# Patient Record
Sex: Female | Born: 1980 | Race: White | Hispanic: No | Marital: Single | Smoking: Never smoker
Health system: Southern US, Community
[De-identification: ages and names within clinical notes are randomized; demographics above are authoritative.]

## PROBLEM LIST (undated history)

## (undated) DIAGNOSIS — I10 Essential (primary) hypertension: Secondary | ICD-10-CM

## (undated) HISTORY — PX: TUBAL LIGATION: SHX77

---

## 2016-02-28 ENCOUNTER — Encounter (HOSPITAL_BASED_OUTPATIENT_CLINIC_OR_DEPARTMENT_OTHER): Payer: Self-pay | Admitting: *Deleted

## 2016-02-28 ENCOUNTER — Emergency Department (HOSPITAL_BASED_OUTPATIENT_CLINIC_OR_DEPARTMENT_OTHER): Payer: Self-pay

## 2016-02-28 ENCOUNTER — Emergency Department (HOSPITAL_BASED_OUTPATIENT_CLINIC_OR_DEPARTMENT_OTHER)
Admission: EM | Admit: 2016-02-28 | Discharge: 2016-02-28 | Disposition: A | Discharge status: Home / Self Care | Payer: Self-pay | Attending: Emergency Medicine | Admitting: Emergency Medicine

## 2016-02-28 DIAGNOSIS — B9789 Other viral agents as the cause of diseases classified elsewhere: Secondary | ICD-10-CM

## 2016-02-28 DIAGNOSIS — J069 Acute upper respiratory infection, unspecified: Secondary | ICD-10-CM | POA: Insufficient documentation

## 2016-02-28 LAB — CBC WITH DIFFERENTIAL/PLATELET
BASOS PCT: 0 %
Basophils Absolute: 0 10*3/uL (ref 0.0–0.1)
Eosinophils Absolute: 0.1 10*3/uL (ref 0.0–0.7)
Eosinophils Relative: 1 %
HCT: 40.7 % (ref 36.0–46.0)
HEMOGLOBIN: 13.6 g/dL (ref 12.0–15.0)
LYMPHS ABS: 0.5 10*3/uL — AB (ref 0.7–4.0)
Lymphocytes Relative: 9 %
MCH: 27.5 pg (ref 26.0–34.0)
MCHC: 33.4 g/dL (ref 30.0–36.0)
MCV: 82.2 fL (ref 78.0–100.0)
MONOS PCT: 9 %
Monocytes Absolute: 0.5 10*3/uL (ref 0.1–1.0)
NEUTROS ABS: 4.8 10*3/uL (ref 1.7–7.7)
NEUTROS PCT: 81 %
Platelets: 166 10*3/uL (ref 150–400)
RBC: 4.95 MIL/uL (ref 3.87–5.11)
RDW: 14.2 % (ref 11.5–15.5)
WBC: 5.9 10*3/uL (ref 4.0–10.5)

## 2016-02-28 LAB — BASIC METABOLIC PANEL
ANION GAP: 10 (ref 5–15)
BUN: 11 mg/dL (ref 6–20)
CO2: 25 mmol/L (ref 22–32)
Calcium: 9.2 mg/dL (ref 8.9–10.3)
Chloride: 102 mmol/L (ref 101–111)
Creatinine, Ser: 0.84 mg/dL (ref 0.44–1.00)
GFR calc non Af Amer: >60 mL/min (ref >60)
Glucose, Bld: 106 mg/dL — ABNORMAL HIGH (ref 65–99)
Potassium: 3.4 mmol/L — ABNORMAL LOW (ref 3.5–5.1)
Sodium: 137 mmol/L (ref 135–145)

## 2016-02-28 LAB — RAPID STREP SCREEN (MED CTR MEBANE ONLY): Streptococcus, Group A Screen (Direct): NEGATIVE

## 2016-02-28 MED ORDER — LISINOPRIL 10 MG PO TABS: 10.0000 mg | ORAL_TABLET | Freq: Every day | ORAL | 0 refills | Status: DC

## 2016-02-28 MED ORDER — GUAIFENESIN-CODEINE 100-10 MG/5ML PO SOLN: 5.0000 mL | Freq: Three times a day (TID) | ORAL | 0 refills | Status: DC | PRN

## 2016-02-28 MED ORDER — ACETAMINOPHEN 500 MG PO TABS
1000.0000 mg | ORAL_TABLET | Freq: Once | ORAL | Status: AC
  Administered 2016-02-28: 1000 mg via ORAL
  Filled 2016-02-28: qty 2

## 2016-02-28 MED ORDER — DEXAMETHASONE SODIUM PHOSPHATE 10 MG/ML IJ SOLN
10.0000 mg | Freq: Once | INTRAMUSCULAR | Status: AC
  Administered 2016-02-28: 10 mg via INTRAVENOUS
  Filled 2016-02-28: qty 1

## 2016-02-28 MED ORDER — SODIUM CHLORIDE 0.9 % IV BOLUS (SEPSIS)
1000.0000 mL | Freq: Once | INTRAVENOUS | Status: AC
  Administered 2016-02-28: 1000 mL via INTRAVENOUS

## 2016-02-28 MED FILL — LISINOPRIL 10 MG TABLET: 10 | 30 days supply | Qty: 30 | Fill #0

## 2016-02-28 MED FILL — GUAIATUSSIN AC LIQUID: 100-10 | 8 days supply | Qty: 120 | Fill #0

## 2016-02-28 NOTE — ED Provider Notes (Signed)
[facility] Provider Note   CSN: [id number] Arrival date & time: 02/28/16  1323     History   Chief Complaint Chief Complaint  Patient presents with  . Cough  . Fever    HPI Denise Murphy is a 35 y.o. female.  35 year old Caucasian female with a past medical history significant for hypertension presents to the ED today with multiple complaints including sore throat, sinus congestion, rhinorrhea, fevers, headaches, body aches. Patient states her symptoms started approximately 2 days ago. Patient states that she started with sinus congestion and rhinorrhea and progressed to a sore throat and a nonproductive cough. Patient states that her fever has been max 102.1 at home. She has been alternating Tylenol and ibuprofen. She is also using over-the-counter cold and flu medication with some relief. Patient also reports generalized body aches. She reports sick contacts. Nothing makes better or worse. Patient also states that she has a history of high blood pressure. However she has been out of her medication for the past few months has not had insurance follow-up with her primary care doctor. She states she was on lisinopril but unknown dose. Patient denies any vision changes, chest pain, shortness of breath, lightheadedness, dizziness, abdominal pain, nausea, emesis, urinary symptoms, change in bowel habits, numbness/tingling.   The history is provided by the patient.  Cough  Associated symptoms include chills, headaches, rhinorrhea and sore throat. Pertinent negatives include no chest pain and no shortness of breath.  Fever   Associated symptoms include congestion, headaches, sore throat and cough. Pertinent negatives include no chest pain, no diarrhea and no vomiting.    History reviewed. No pertinent past medical history.  There are no active problems to display for this patient.   Past Surgical History:  Procedure Laterality Date  . TUBAL LIGATION      OB History    No data available       Home Medications    Prior to Admission medications   Medication Sig Start Date End Date Taking? Authorizing Provider  guaiFENesin-codeine 100-10 MG/5ML syrup Take 5 mLs by mouth 3 (three) times daily as needed for cough. 02/28/16   Rise Mu, PA-C  lisinopril (PRINIVIL,ZESTRIL) 10 MG tablet Take 1 tablet (10 mg total) by mouth daily. 02/28/16   Rise Mu, PA-C    Family History No family history on file.  Social History Social History  Substance Use Topics  . Smoking status: Never Smoker  . Smokeless tobacco: Never Used  . Alcohol use No     Allergies   Patient has no known allergies.   Review of Systems Review of Systems  Constitutional: Positive for chills and fever.  HENT: Positive for congestion, postnasal drip, rhinorrhea, sinus pain, sinus pressure and sore throat.   Respiratory: Positive for cough. Negative for shortness of breath.   Cardiovascular: Negative for chest pain.  Gastrointestinal: Negative for abdominal pain, diarrhea, nausea and vomiting.  Genitourinary: Negative for dysuria, frequency, hematuria and urgency.  Musculoskeletal: Negative for neck pain and neck stiffness.  Skin: Negative.   Neurological: Positive for headaches. Negative for dizziness, syncope, weakness, light-headedness and numbness.  All other systems reviewed and are negative.    Physical Exam Updated Vital Signs BP (!) 175/114 (BP Location: Left Arm)   Pulse 84   Temp 98.3 F (36.8 C) (Oral)   Resp 18   Ht 5\' 7"  (1.702 m)   Wt 72.6 kg   LMP 02/07/2016   SpO2 100%   BMI 25.06 kg/m  Physical Exam  Constitutional: She is oriented to person, place, and time. She appears well-developed and well-nourished. No distress.  HENT:  Head: Normocephalic and atraumatic.  Right Ear: Tympanic membrane, external ear and ear canal normal.  Left Ear: Tympanic membrane, external ear and ear canal normal.  Nose: Mucosal edema and rhinorrhea  present. Right sinus exhibits maxillary sinus tenderness and frontal sinus tenderness. Left sinus exhibits maxillary sinus tenderness and frontal sinus tenderness.  Mouth/Throat: Uvula is midline. Mucous membranes are dry. No trismus in the jaw. Oropharyngeal exudate and posterior oropharyngeal erythema present. No posterior oropharyngeal edema or tonsillar abscesses. Tonsils are 1+ on the right. Tonsils are 1+ on the left. No tonsillar exudate.  Eyes: Conjunctivae are normal. Right eye exhibits no discharge. Left eye exhibits no discharge. No scleral icterus.  Neck: Normal range of motion. Neck supple. No thyromegaly present.  Patient with full range of motion of cervical spine with no nuchal rigidity.  Cardiovascular: Regular rhythm, normal heart sounds and intact distal pulses.   Patient is mildly tachycardic on initial exam.  Pulmonary/Chest: Effort normal and breath sounds normal. No respiratory distress. She has no wheezes.  CTAB. Non productive cough noted. Patient is not hypoxic or tachypneic.  Abdominal: Soft. Bowel sounds are normal. She exhibits no distension. There is no tenderness. There is no rebound and no guarding.  Musculoskeletal: Normal range of motion.  Lymphadenopathy:    She has no cervical adenopathy.  Neurological: She is alert and oriented to person, place, and time.  Skin: Skin is warm and dry. Capillary refill takes less than 2 seconds.  Nursing note and vitals reviewed.    ED Treatments / Results  Labs (all labs ordered are listed, but only abnormal results are displayed) Labs Reviewed  BASIC METABOLIC PANEL - Abnormal; Notable for the following:       Result Value   Potassium 3.4 (*)    Glucose, Bld 106 (*)    All other components within normal limits  CBC WITH DIFFERENTIAL/PLATELET - Abnormal; Notable for the following:    Lymphs Abs 0.5 (*)    All other components within normal limits  RAPID STREP SCREEN (NOT AT [facility])  CULTURE, GROUP A STREP [facility])     EKG  EKG Interpretation None       Radiology Dg Chest 2 View  Result Date: 02/28/2016 CLINICAL DATA:  Productive cough for 3 days.  Fever EXAM: CHEST  2 VIEW COMPARISON:  None. FINDINGS: Heart and mediastinal contours are within normal limits. No focal opacities or effusions. No acute bony abnormality. IMPRESSION: No active cardiopulmonary disease. Electronically Signed   By: Charlett NoseKevin  Dover M.D.   On: 02/28/2016 14:44    Procedures Procedures (including critical care time)  Medications Ordered in ED Medications  acetaminophen (TYLENOL) tablet 1,000 mg (1,000 mg Oral Given 02/28/16 1345)  dexamethasone (DECADRON) injection 10 mg (10 mg Intravenous Given 02/28/16 1508)  sodium chloride 0.9 % bolus 1,000 mL (1,000 mLs Intravenous New Bag/Given 02/28/16 1508)     Initial Impression / Assessment and Plan / ED Course  I have reviewed the triage vital signs and the nursing notes.  Pertinent labs & imaging results that were available during my care of the patient were reviewed by me and considered in my medical decision making (see chart for details).  Clinical Course   Patient presents to the ED with multiple complaints including rhinorrhea, sore throat, cough, headache, fever, generalized body aches. Patient was initially tachycardic on exam with a slightly elevated  temperature. She was given Tylenol in the ED. Labs were unremarkable. No leukocytosis noted. Patient mildly dehydrated on exam. Given a liter of fluid. Tachycardia has improved. Repeat heart rate was 84 and patient was afebrile. Pt CXR negative for acute infiltrate. Patients symptoms are consistent with URI, likely viral etiology. Discussed that antibiotics are not indicated for viral infections. Patient with sore throat. Decadron given. Patient also has history of hypertension and states she has not taken her meds for the past few months due to insurance issues. She was also lisinopril but unknown dose. Have given patient 10  mg of lisinopril and encouraged her to follow-up with her primary care doctor. I have given her financial resource guide to help find a primary care coverage in the area. Patient is nontoxic appearing. Encourage importance of staying hydrated.. Pt will be discharged with symptomatic treatment.  Verbalizes understanding and is agreeable with plan. Pt is hemodynamically stable & in NAD prior to dc. Patient given strict return precautions.   Final Clinical Impressions(s) / ED Diagnoses   Final diagnoses:  Viral URI with cough    New Prescriptions New Prescriptions   GUAIFENESIN-CODEINE 100-10 MG/5ML SYRUP    Take 5 mLs by mouth 3 (three) times daily as needed for cough.   LISINOPRIL (PRINIVIL,ZESTRIL) 10 MG TABLET    Take 1 tablet (10 mg total) by mouth daily.     Rise MuKenneth T Tyreonna Czaplicki, PA-C 02/28/16 1649    Raeford RazorStephen Kohut, MD 03/05/16 [phone]

## 2016-02-28 NOTE — ED Triage Notes (Signed)
Cough, headache, body aches and fever x 2 days.

## 2016-02-28 NOTE — Discharge Instructions (Signed)
Your chest xray was normal today. Your labs work was normal. Your strep test was negative. This is likely a viral infection and sinus infection. You may take the cough medicine it will make you sleepy so do not drive with it. You also need to stay hydrated with water. I would also recommend nasal rinses with saline and a nasal decongestant. You need to follow up with a primary care doctor concerning your blood pressure and have it rechecked. Return to the Ed if your symptoms are worsening or not improving in the next 5-6 days. Please get plenty of rest and take tylenol and motrin as needed for fever and body aches.

## 2016-02-28 NOTE — ED Notes (Signed)
Pt reports temp at home of 102.1 over the past few days. Pt alternating tylenol and ibuprofen. Last dose of ibuprofen at 11 this morning.

## 2016-03-02 LAB — CULTURE, GROUP A STREP (THRC)

## 2016-11-11 ENCOUNTER — Emergency Department (HOSPITAL_BASED_OUTPATIENT_CLINIC_OR_DEPARTMENT_OTHER)
Admission: EM | Admit: 2016-11-11 | Discharge: 2016-11-11 | Disposition: A | Discharge status: Home / Self Care | Payer: Self-pay | Attending: Emergency Medicine | Admitting: Emergency Medicine

## 2016-11-11 ENCOUNTER — Encounter (HOSPITAL_BASED_OUTPATIENT_CLINIC_OR_DEPARTMENT_OTHER): Payer: Self-pay | Admitting: *Deleted

## 2016-11-11 DIAGNOSIS — M5441 Lumbago with sciatica, right side: Secondary | ICD-10-CM | POA: Insufficient documentation

## 2016-11-11 DIAGNOSIS — I10 Essential (primary) hypertension: Secondary | ICD-10-CM | POA: Insufficient documentation

## 2016-11-11 HISTORY — DX: Essential (primary) hypertension: I10

## 2016-11-11 LAB — PREGNANCY, URINE: PREG TEST UR: NEGATIVE

## 2016-11-11 LAB — URINALYSIS, ROUTINE W REFLEX MICROSCOPIC
Bilirubin Urine: NEGATIVE
Glucose, UA: NEGATIVE mg/dL
Ketones, ur: 15 mg/dL — AB
LEUKOCYTES UA: NEGATIVE
Nitrite: NEGATIVE
PROTEIN: 30 mg/dL — AB
Specific Gravity, Urine: 1.02 (ref 1.005–1.030)
pH: 7 (ref 5.0–8.0)

## 2016-11-11 LAB — URINALYSIS, MICROSCOPIC (REFLEX): WBC UA: NONE SEEN WBC/hpf (ref 0–5)

## 2016-11-11 MED ORDER — OXYCODONE-ACETAMINOPHEN 5-325 MG PO TABS
1.0000 | ORAL_TABLET | Freq: Once | ORAL | Status: AC
  Administered 2016-11-11: 1 via ORAL
  Filled 2016-11-11: qty 1

## 2016-11-11 MED ORDER — NAPROXEN 500 MG PO TABS: 500.0000 mg | ORAL_TABLET | Freq: Two times a day (BID) | ORAL | 0 refills | Status: DC

## 2016-11-11 MED ORDER — RANITIDINE HCL 150 MG PO CAPS: 150.0000 mg | ORAL_CAPSULE | Freq: Every day | ORAL | 0 refills | Status: DC

## 2016-11-11 MED ORDER — DEXAMETHASONE SODIUM PHOSPHATE 10 MG/ML IJ SOLN
10.0000 mg | Freq: Once | INTRAMUSCULAR | Status: AC
  Administered 2016-11-11: 10 mg via INTRAVENOUS
  Filled 2016-11-11: qty 1

## 2016-11-11 MED ORDER — METHOCARBAMOL 500 MG PO TABS: 500.0000 mg | ORAL_TABLET | Freq: Two times a day (BID) | ORAL | 0 refills | Status: DC | PRN

## 2016-11-11 MED ORDER — PREDNISONE 10 MG (21) PO TBPK: ORAL_TABLET | Freq: Every day | ORAL | 0 refills | Status: DC

## 2016-11-11 NOTE — ED Triage Notes (Signed)
Pt reports mid to lower back pain radiating down right leg. Denies injury or trauma.

## 2016-11-11 NOTE — ED Provider Notes (Signed)
[facility]   CSN: [id number] Arrival date & time: 11/11/16  1504     History   Chief Complaint Chief Complaint  Patient presents with  . Back Pain    HPI Denise Murphy is a 36 y.o. female presenting with back pain.  Patient states that a week ago, she had bilateral low back pain. She tried to manage this conservatively with Tylenol, ibuprofen, ice, and heat, but the pain has continued to worsen. She now reports a burning radiation down the back of her right leg, past her knee. Additionally, she has bilateral upper back soreness, which started a few days ago. Her pain is worse with movement, and nothing makes it better. She denies increased activity or strain his activity. She denies fall, trauma, or injury. She has had back pain once in the past after having pain after lifting a heavy box. She denies fevers, chills, chest pain, shortness of breath, nausea, vomiting, abdominal pain, urinary symptoms, or abnormal bowel movements. She denies loss of bowel or bladder control. She denies any numbness. She denies history of cancer or IV drug use. She denies headache or neck stiffness.  Patient states she has medical history of hypertension, for which she is not taking medications.  HPI  Past Medical History:  Diagnosis Date  . Hypertension     There are no active problems to display for this patient.   Past Surgical History:  Procedure Laterality Date  . TUBAL LIGATION      OB History    No data available       Home Medications    Prior to Admission medications   Medication Sig Start Date End Date Taking? Authorizing Provider  methocarbamol (ROBAXIN) 500 MG tablet Take 1 tablet (500 mg total) by mouth 2 (two) times daily as needed for muscle spasms. Take only if you continue to have muscle stiffness and soreness 11/11/16   Rochell Mabie, PA-C  naproxen (NAPROSYN) 500 MG tablet Take 1 tablet (500 mg total) by mouth 2 (two) times daily with a  meal. 11/11/16 11/25/16  Marykatherine Sherwood, PA-C  predniSONE (STERAPRED UNI-PAK 21 TAB) 10 MG (21) TBPK tablet Take by mouth daily. Take 6 tabs by mouth daily  for 2 days, then 5 tabs for 2 days, then 4 tabs for 2 days, then 3 tabs for 2 days, 2 tabs for 2 days, then 1 tab by mouth daily for 2 days 11/11/16   Zyniah Ferraiolo, PA-C  ranitidine (ZANTAC) 150 MG capsule Take 1 capsule (150 mg total) by mouth daily. 11/11/16   Rahkim Rabalais, PA-C    Family History History reviewed. No pertinent family history.  Social History Social History  Substance Use Topics  . Smoking status: Never Smoker  . Smokeless tobacco: Never Used  . Alcohol use No     Allergies   Patient has no known allergies.   Review of Systems Review of Systems  Constitutional: Negative for chills and fever.  Respiratory: Negative for cough, chest tightness and shortness of breath.   Gastrointestinal: Negative for blood in stool, constipation, diarrhea, nausea and vomiting.  Musculoskeletal: Positive for back pain.  Skin: Negative for rash and wound.  Allergic/Immunologic: Negative for immunocompromised state.  Neurological: Negative for dizziness, facial asymmetry and numbness.  Hematological: Does not bruise/bleed easily.  Psychiatric/Behavioral: Negative for agitation, confusion and decreased concentration.     Physical Exam Updated Vital Signs BP (!) 165/104 (BP Location: Right Arm)   Pulse 88   Temp 98.4  F (36.9 C) (Oral)   Resp 18   LMP 11/03/2016   SpO2 98%   Physical Exam  Constitutional: She is oriented to person, place, and time. She appears well-developed and well-nourished.  Patient appears uncomfortable due to pain  HENT:  Head: Normocephalic and atraumatic.  Eyes: EOM are normal.  Neck: Normal range of motion. Neck supple.  No tenderness to palpation of midline cervical spine. Patient moving head and neck easily without pain or stiffness  Cardiovascular: Normal rate, regular rhythm and  intact distal pulses.   Pulmonary/Chest: Effort normal and breath sounds normal. No respiratory distress. She has no wheezes. She has no rales. She exhibits no tenderness.  Abdominal: Soft. Bowel sounds are normal. She exhibits no distension. There is no tenderness.  Musculoskeletal: Normal range of motion.  Tenderness to palpation of bilateral upper thoracic back musculature. No tenderness to palpation of midline spine. No pain of lower thoracic/upper lumbar back. Tenderness to palpation of lumbar back bilaterally, with out increased tenderness to palpation over spinous processes. Straight leg reproduces pain and causes burning to extend down the right leg past the knee. Strength intact 4. Sensation intact 4. Radial and pedal pulses equal bilaterally. No obvious rashes, contusions, or deformities of the back.  Neurological: She is alert and oriented to person, place, and time. She has normal strength. No sensory deficit. GCS eye subscore is 4. GCS verbal subscore is 5. GCS motor subscore is 6.  Skin: Skin is warm and dry. No rash noted.  Psychiatric: She has a normal mood and affect.  Nursing note and vitals reviewed.    ED Treatments / Results  Labs (all labs ordered are listed, but only abnormal results are displayed) Labs Reviewed  URINALYSIS, ROUTINE W REFLEX MICROSCOPIC - Abnormal; Notable for the following:       Result Value   Hgb urine dipstick TRACE (*)    Ketones, ur 15 (*)    Protein, ur 30 (*)    All other components within normal limits  URINALYSIS, MICROSCOPIC (REFLEX) - Abnormal; Notable for the following:    Bacteria, UA MANY (*)    Squamous Epithelial / LPF 6-30 (*)    All other components within normal limits  PREGNANCY, URINE    EKG  EKG Interpretation None       Radiology No results found.  Procedures Procedures (including critical care time)  Medications Ordered in ED Medications  oxyCODONE-acetaminophen (PERCOCET/ROXICET) 5-325 MG per tablet 1  tablet (1 tablet Oral Given 11/11/16 1611)  dexamethasone (DECADRON) injection 10 mg (10 mg Intravenous Given 11/11/16 1611)     Initial Impression / Assessment and Plan / ED Course  I have reviewed the triage vital signs and the nursing notes.  Pertinent labs & imaging results that were available during my care of the patient were reviewed by me and considered in my medical decision making (see chart for details).     Patient presenting with back pain 1 week. No red flags for back pain. Physical exam shows tenderness to palpation of lower back bilaterally with positive straight leg raise. Patient also with tenderness palpation of upper back musculature. Patient neurovascularly intact. Appears uncomfortable due to pain. Blood pressure elevated, patient with a history of hypertension and in pain. Discussed follow-up with primary care for monitoring and management of blood pressure. Will give Decadron and percocet here, and d/c with NSAIDs, steroids and ranitidine for ulcer prevention. Pt give robaxin rx for use only if upper back soreness is not improved  at the end of the prednisone course. At this time, pt appears safe for discharge. Return precautions given. Pt states she understands and agrees to plan.   Final Clinical Impressions(s) / ED Diagnoses   Final diagnoses:  Acute bilateral low back pain with right-sided sciatica    New Prescriptions Discharge Medication List as of 11/11/2016  4:06 PM    START taking these medications   Details  methocarbamol (ROBAXIN) 500 MG tablet Take 1 tablet (500 mg total) by mouth 2 (two) times daily as needed for muscle spasms. Take only if you continue to have muscle stiffness and soreness, Starting Wed 11/11/2016, Print    naproxen (NAPROSYN) 500 MG tablet Take 1 tablet (500 mg total) by mouth 2 (two) times daily with a meal., Starting Wed 11/11/2016, Until Wed 11/25/2016, Print    predniSONE (STERAPRED UNI-PAK 21 TAB) 10 MG (21) TBPK tablet Take by  mouth daily. Take 6 tabs by mouth daily  for 2 days, then 5 tabs for 2 days, then 4 tabs for 2 days, then 3 tabs for 2 days, 2 tabs for 2 days, then 1 tab by mouth daily for 2 days, Starting Wed 11/11/2016, Print    ranitidine (ZANTAC) 150 MG capsule Take 1 capsule (150 mg total) by mouth daily., Starting Wed 11/11/2016, Print         [place], [place], PA-C 11/11/16 1731    Pricilla Loveless, MD 11/13/16 1243

## 2016-11-11 NOTE — Discharge Instructions (Signed)
Take naproxen twice a day with meals. Do not take other anti-inflammatories at the same time (Advil, ibuprofen, Motrin, Aleve). You may take Tylenol as needed for further pain control. Take prednisone as prescribed. Every 2 days, you will take slightly less. Take ranitidine once daily while taking the 2 medicines together to prevent stomach ulcer. You may use Robaxin twice a day for muscle stiffness or soreness after you have finished prednisone. Do not take the 2 medicines together. Have caution while taking Robaxin, as this may make you tired. Do not drive or operate heavy machinery until you know how this affects see. Do not do any strenuous activity or heavy lifting until symptoms have completely resolved. Return to the emergency room if you develop fever, chills, loss of bowel or bladder control, or any new or worsening symptoms.  You may follow up with [facility] for primary care needs. This is important, as it is very important to keep your blood pressure under control.

## 2016-11-11 NOTE — ED Notes (Signed)
Patient has difficulty moving her BLE to the bed d/t low back pain.

## 2016-11-16 ENCOUNTER — Emergency Department (HOSPITAL_BASED_OUTPATIENT_CLINIC_OR_DEPARTMENT_OTHER): Payer: Self-pay

## 2016-11-16 ENCOUNTER — Emergency Department (HOSPITAL_BASED_OUTPATIENT_CLINIC_OR_DEPARTMENT_OTHER)
Admission: EM | Admit: 2016-11-16 | Discharge: 2016-11-17 | Disposition: A | Discharge status: Home / Self Care | Payer: Self-pay | Attending: Emergency Medicine | Admitting: Emergency Medicine

## 2016-11-16 ENCOUNTER — Encounter (HOSPITAL_BASED_OUTPATIENT_CLINIC_OR_DEPARTMENT_OTHER): Payer: Self-pay

## 2016-11-16 DIAGNOSIS — R55 Syncope and collapse: Secondary | ICD-10-CM

## 2016-11-16 DIAGNOSIS — R519 Headache, unspecified: Secondary | ICD-10-CM

## 2016-11-16 DIAGNOSIS — I160 Hypertensive urgency: Secondary | ICD-10-CM | POA: Insufficient documentation

## 2016-11-16 DIAGNOSIS — R51 Headache: Secondary | ICD-10-CM | POA: Insufficient documentation

## 2016-11-16 DIAGNOSIS — Z79899 Other long term (current) drug therapy: Secondary | ICD-10-CM | POA: Insufficient documentation

## 2016-11-16 LAB — CBC WITH DIFFERENTIAL/PLATELET
BASOS ABS: 0 10*3/uL (ref 0.0–0.1)
Basophils Relative: 0 %
Eosinophils Absolute: 0 10*3/uL (ref 0.0–0.7)
Eosinophils Relative: 0 %
HEMATOCRIT: 41.5 % (ref 36.0–46.0)
Hemoglobin: 14.3 g/dL (ref 12.0–15.0)
LYMPHS PCT: 12 %
Lymphs Abs: 1.5 10*3/uL (ref 0.7–4.0)
MCH: 27.8 pg (ref 26.0–34.0)
MCHC: 34.5 g/dL (ref 30.0–36.0)
MCV: 80.6 fL (ref 78.0–100.0)
Monocytes Absolute: 0.6 10*3/uL (ref 0.1–1.0)
Monocytes Relative: 5 %
NEUTROS ABS: 10.3 10*3/uL — AB (ref 1.7–7.7)
NEUTROS PCT: 83 %
Platelets: 269 10*3/uL (ref 150–400)
RBC: 5.15 MIL/uL — AB (ref 3.87–5.11)
RDW: 14 % (ref 11.5–15.5)
WBC: 12.4 10*3/uL — AB (ref 4.0–10.5)

## 2016-11-16 MED ORDER — DIPHENHYDRAMINE HCL 50 MG/ML IJ SOLN
25.0000 mg | Freq: Once | INTRAMUSCULAR | Status: AC
  Administered 2016-11-16: 25 mg via INTRAVENOUS
  Filled 2016-11-16: qty 1

## 2016-11-16 MED ORDER — SODIUM CHLORIDE 0.9 % IV BOLUS (SEPSIS)
1000.0000 mL | Freq: Once | INTRAVENOUS | Status: AC
  Administered 2016-11-16: 1000 mL via INTRAVENOUS

## 2016-11-16 MED ORDER — PROCHLORPERAZINE EDISYLATE 5 MG/ML IJ SOLN
10.0000 mg | Freq: Once | INTRAMUSCULAR | Status: AC
  Administered 2016-11-16: 10 mg via INTRAVENOUS
  Filled 2016-11-16: qty 2

## 2016-11-16 NOTE — ED Provider Notes (Signed)
[facility] Provider Note   CSN: [id number] Arrival date & time: 11/16/16  2231     History   Chief Complaint Chief Complaint  Patient presents with  . Loss of Consciousness    HPI Denise Murphy is a 36 y.o. female.  HPI  This is a 36 year old female with a history of hypertension who presents with headache, chest pain, syncope. Patient reports over the last several days she has not felt "right." She knows that her blood pressure has been elevated but does not have insurance and is not currently taking blood pressure medication. She reports several day history of gradually worsening headache. Currently her headache is 10 out of 10. Denies worst headache of her life. Denies weakness, numbness, tingling, vision changes. Also reports occasional chest tightness. Denies chest tightness at this time. Patient reports that earlier this evening she got up to go to the bathroom. She states "I blacked out." She was found by her boyfriend on the floor. Prior to syncope, she states that she felt ringing in her ears and pounding in her head.  Past Medical History:  Diagnosis Date  . Hypertension     There are no active problems to display for this patient.   Past Surgical History:  Procedure Laterality Date  . TUBAL LIGATION      OB History    No data available       Home Medications    Prior to Admission medications   Medication Sig Start Date End Date Taking? Authorizing Provider  hydrochlorothiazide (HYDRODIURIL) 25 MG tablet Take 1 tablet (25 mg total) by mouth daily. 11/17/16   Kaeden Mester, Mayer Masker, MD  lisinopril (PRINIVIL,ZESTRIL) 10 MG tablet Take 1 tablet (10 mg total) by mouth daily. 11/17/16   Nikalas Bramel, Mayer Masker, MD  methocarbamol (ROBAXIN) 500 MG tablet Take 1 tablet (500 mg total) by mouth 2 (two) times daily as needed for muscle spasms. Take only if you continue to have muscle stiffness and soreness 11/11/16   Caccavale, Sophia, PA-C  naproxen (NAPROSYN) 500 MG  tablet Take 1 tablet (500 mg total) by mouth 2 (two) times daily with a meal. 11/11/16 11/25/16  Caccavale, Sophia, PA-C  predniSONE (STERAPRED UNI-PAK 21 TAB) 10 MG (21) TBPK tablet Take by mouth daily. Take 6 tabs by mouth daily  for 2 days, then 5 tabs for 2 days, then 4 tabs for 2 days, then 3 tabs for 2 days, 2 tabs for 2 days, then 1 tab by mouth daily for 2 days 11/11/16   Caccavale, Sophia, PA-C  ranitidine (ZANTAC) 150 MG capsule Take 1 capsule (150 mg total) by mouth daily. 11/11/16   Caccavale, Sophia, PA-C    Family History No family history on file.  Social History Social History  Substance Use Topics  . Smoking status: Never Smoker  . Smokeless tobacco: Never Used  . Alcohol use Yes     Comment: occ     Allergies   Patient has no known allergies.   Review of Systems Review of Systems  Constitutional: Negative for fever.  Respiratory: Positive for chest tightness. Negative for shortness of breath.   Cardiovascular: Negative for chest pain and leg swelling.  Gastrointestinal: Positive for nausea. Negative for abdominal pain and vomiting.  Genitourinary: Negative for dysuria.  Musculoskeletal: Positive for back pain.  Neurological: Positive for syncope and headaches. Negative for weakness and numbness.  All other systems reviewed and are negative.    Physical Exam Updated Vital Signs BP (!) 199/119 (BP  Location: Left Arm)   Pulse 73   Temp 98.4 F (36.9 C) (Oral)   Resp 18   Ht  (1.702 m)   Wt 72.6 kg (160 lb)   LMP 11/03/2016   SpO2 99%   BMI 25.06 kg/m   Physical Exam  Constitutional: She is oriented to person, place, and time. She appears well-developed and well-nourished.  Overweight, anxious appearing  HENT:  Head: Normocephalic and atraumatic.  Eyes: Pupils are equal, round, and reactive to light. EOM are normal.  Neck: Normal range of motion. Neck supple.  Normal range of motion, no meningismus  Cardiovascular: Regular rhythm and normal  heart sounds.   Tachycardia  Pulmonary/Chest: Effort normal and breath sounds normal. No respiratory distress. She has no wheezes.  Abdominal: Soft. Bowel sounds are normal. There is no tenderness. There is no guarding.  Neurological: She is alert and oriented to person, place, and time.  55 strength in all 4 extremities, no dysmetria to finger-nose-finger, cranial nerves II through XII intact  Skin: Skin is warm and dry.  Psychiatric: She has a normal mood and affect.  Nursing note and vitals reviewed.    ED Treatments / Results  Labs (all labs ordered are listed, but only abnormal results are displayed) Labs Reviewed  CBC WITH DIFFERENTIAL/PLATELET - Abnormal; Notable for the following:       Result Value   WBC 12.4 (*)    RBC 5.15 (*)    Neutro Abs 10.3 (*)    All other components within normal limits  BASIC METABOLIC PANEL - Abnormal; Notable for the following:    Potassium 3.4 (*)    Glucose, Bld 132 (*)    BUN 21 (*)    All other components within normal limits  PREGNANCY, URINE  TROPONIN I  D-DIMER, QUANTITATIVE (NOT AT [facility])    EKG  EKG Interpretation  Date/Time:  Monday November 16 2016 22:50:51 EDT Ventricular Rate:  122 PR Interval:  142 QRS Duration: 78 QT Interval:  318 QTC Calculation: 453 R Axis:   37 Text Interpretation:  Sinus tachycardia Biatrial enlargement Septal infarct , age undetermined Abnormal ECG no previous EKG significant sinus tachycardia  Confirmed by Crista Curb [id number]) on 11/16/2016 10:54:42 PM       Radiology Dg Chest 2 View  Result Date: 11/16/2016 CLINICAL DATA:  Headache for days, chest pain for several days but worse today. Hypertension. EXAM: CHEST  2 VIEW COMPARISON:  Chest x-ray dated 02/28/2016. FINDINGS: The heart size and mediastinal contours are within normal limits. Both lungs are clear. The visualized skeletal structures are unremarkable. IMPRESSION: No active cardiopulmonary disease. No evidence of pneumonia or pulmonary  edema. Electronically Signed   By: Bary Richard M.D.   On: 11/16/2016 23:55   Ct Head Wo Contrast  Result Date: 11/16/2016 CLINICAL DATA:  Headache for several days EXAM: CT HEAD WITHOUT CONTRAST TECHNIQUE: Contiguous axial images were obtained from the base of the skull through the vertex without intravenous contrast. COMPARISON:  None. FINDINGS: Brain: No evidence of acute infarction, hemorrhage, hydrocephalus, extra-axial collection or mass lesion/mass effect. Vascular: No hyperdense vessel or unexpected calcification. Skull: Normal. Negative for fracture or focal lesion. Sinuses/Orbits: Mucosal thickening in the maxillary and ethmoid sinuses. No acute orbital abnormality. Other: None IMPRESSION: No CT evidence for acute intracranial abnormality. Electronically Signed   By: Jasmine Pang M.D.   On: 11/16/2016 23:46    Procedures Procedures (including critical care time)  CRITICAL CARE Performed by: Shon Baton  Total critical care time: 25 minutes  Critical care time was exclusive of separately billable procedures and treating other patients.  Critical care was necessary to treat or prevent imminent or life-threatening deterioration.  Critical care was time spent personally by me on the following activities: development of treatment plan with patient and/or surrogate as well as nursing, discussions with consultants, evaluation of patient's response to treatment, examination of patient, obtaining history from patient or surrogate, ordering and performing treatments and interventions, ordering and review of laboratory studies, ordering and review of radiographic studies, pulse oximetry and re-evaluation of patient's condition.   Medications Ordered in ED Medications  sodium chloride 0.9 % bolus 1,000 mL (0 mLs Intravenous Stopped 11/17/16 0126)  diphenhydrAMINE (BENADRYL) injection 25 mg (25 mg Intravenous Given 11/16/16 2356)  prochlorperazine (COMPAZINE) injection 10 mg (10 mg  Intravenous Given 11/16/16 2356)  magnesium sulfate IVPB 2 g 50 mL (0 g Intravenous Stopped 11/17/16 0118)  hydrochlorothiazide (HYDRODIURIL) tablet 25 mg (25 mg Oral Given 11/17/16 0125)  lisinopril (PRINIVIL,ZESTRIL) tablet 10 mg (10 mg Oral Given 11/17/16 0125)     Initial Impression / Assessment and Plan / ED Course  I have reviewed the triage vital signs and the nursing notes.  Pertinent labs & imaging results that were available during my care of the patient were reviewed by me and considered in my medical decision making (see chart for details).  Clinical Course as of Nov 17 198  Tue Nov 17, 2016  0117 On recheck, patient reports significant improvement of headache. Current blood pressure 159/107. She remains nonfocal. Suspect she had some element of hypertensive urgency which is resolving. She has an appointment [facility] later this month. Will reinitiate blood pressure medications to include HCTZ and lisinopril.  [CH]    Clinical Course User Index [CH] Tyra Michelle, Mayer Masker, MD    Reports history of hypertension off meds. Reports headache, syncope, chest pain. She's anxious appearing but nontoxic. Initially tachycardic but on my exam improved. Blood pressure initially 220s over 140s. Patient reports baseline blood pressures 180s over 110s. She is neurologically intact. Basic labwork obtained including troponin and d-dimer. Given ongoing headache, I'll obtain CT scan. Headache has been progressively worsening and she denies worst headache of her life.    EKG is nonischemic. Patient was given headache cocktail including magnesium to address blood pressure. Chest x-ray is negative. CT head does not show any evidence of acute bleed. Basic labwork is reassuring including a troponin and d-dimer. On recheck, she states she feels much better. Blood pressure now 160s over 110s. Suspect some element of hypertensive urgency. Patient needs to be restarted on medications. She was given one dose of  HCTZ and lisinopril. Doubt subarachnoid hemorrhage. No evidence of end organ damage at this time. However, suspect patient symptoms are all related to ongoing severe hypertension. She has a appointment at [facility] in one week. Will initiate on HCTZ and lisinopril. Of note, at discharge, patient's blood pressure has trended back upwards. She remains asymptomatic and states she feels much improved and is ready for discharge. Recommend close blood pressure monitoring and taking medications as prescribed. Patient was given strict return precautions.  After history, exam, and medical workup I feel the patient has been appropriately medically screened and is safe for discharge home. Pertinent diagnoses were discussed with the patient. Patient was given return precautions.   Final Clinical Impressions(s) / ED Diagnoses   Final diagnoses:  Hypertensive urgency  Vasovagal syncope  Acute nonintractable headache,  unspecified headache type    New Prescriptions New Prescriptions   HYDROCHLOROTHIAZIDE (HYDRODIURIL) 25 MG TABLET    Take 1 tablet (25 mg total) by mouth daily.   LISINOPRIL (PRINIVIL,ZESTRIL) 10 MG TABLET    Take 1 tablet (10 mg total) by mouth daily.     Shon Baton, MD 11/17/16 [phone]

## 2016-11-16 NOTE — ED Triage Notes (Addendum)
Pt states she had LOC while she was in the BR at home approx 1015pm--pt was found lying on BR floor by fiance-pt entered triage hysterically crying-states she has had a HA for days-presents to triage in w/c

## 2016-11-17 LAB — BASIC METABOLIC PANEL
ANION GAP: 10 (ref 5–15)
BUN: 21 mg/dL — ABNORMAL HIGH (ref 6–20)
CHLORIDE: 101 mmol/L (ref 101–111)
CO2: 25 mmol/L (ref 22–32)
Calcium: 9.4 mg/dL (ref 8.9–10.3)
Creatinine, Ser: 0.74 mg/dL (ref 0.44–1.00)
GFR calc Af Amer: >60 mL/min (ref >60)
GFR calc non Af Amer: >60 mL/min (ref >60)
GLUCOSE: 132 mg/dL — AB (ref 65–99)
POTASSIUM: 3.4 mmol/L — AB (ref 3.5–5.1)
Sodium: 136 mmol/L (ref 135–145)

## 2016-11-17 LAB — TROPONIN I: Troponin I: <0.03 ng/mL (ref <0.03)

## 2016-11-17 LAB — PREGNANCY, URINE: Preg Test, Ur: NEGATIVE

## 2016-11-17 LAB — D-DIMER, QUANTITATIVE (NOT AT ARMC)

## 2016-11-17 MED ORDER — HYDROCHLOROTHIAZIDE 25 MG PO TABS
25.0000 mg | ORAL_TABLET | Freq: Once | ORAL | Status: AC
  Administered 2016-11-17: 25 mg via ORAL
  Filled 2016-11-17: qty 1

## 2016-11-17 MED ORDER — HYDROCHLOROTHIAZIDE 25 MG PO TABS: 25.0000 mg | ORAL_TABLET | Freq: Every day | ORAL | 0 refills | Status: DC

## 2016-11-17 MED ORDER — MAGNESIUM SULFATE 2 GM/50ML IV SOLN
2.0000 g | Freq: Once | INTRAVENOUS | Status: AC
  Administered 2016-11-17: 2 g via INTRAVENOUS
  Filled 2016-11-17: qty 50

## 2016-11-17 MED ORDER — LISINOPRIL 10 MG PO TABS: 10.0000 mg | ORAL_TABLET | Freq: Every day | ORAL | 0 refills | Status: DC

## 2016-11-17 MED ORDER — LISINOPRIL 10 MG PO TABS
10.0000 mg | ORAL_TABLET | Freq: Once | ORAL | Status: AC
  Administered 2016-11-17: 10 mg via ORAL
  Filled 2016-11-17: qty 1

## 2016-11-17 NOTE — ED Notes (Signed)
Pt laying flat for orthostatics  

## 2016-11-17 NOTE — ED Notes (Signed)
Pt reports not being able to take her BP medication for 2 years

## 2016-11-17 NOTE — Discharge Instructions (Signed)
You were seen today for high blood pressure and passing out. Your workup is largely reassuring. You did have fairly high blood pressures but improved with medications.You will be started on 2 blood pressure medications. It is very important that you monitor your blood pressure closely and follow-up with your primary as scheduled.

## 2016-11-17 NOTE — ED Notes (Signed)
Pt ambulated to BR with this RN assitance, pt had steady gate and denied any lightheadedness or dizziness. Pt reports headache is "easing off"

## 2016-11-24 ENCOUNTER — Encounter: Payer: Self-pay | Admitting: Physician Assistant

## 2016-11-24 ENCOUNTER — Ambulatory Visit: Payer: Self-pay | Attending: Internal Medicine | Admitting: Physician Assistant

## 2016-11-24 VITALS — BP 139/85 | HR 106 | Temp 98.4°F | Resp 18 | Ht 67.0 in | Wt 182.0 lb

## 2016-11-24 DIAGNOSIS — I10 Essential (primary) hypertension: Secondary | ICD-10-CM | POA: Insufficient documentation

## 2016-11-24 DIAGNOSIS — Z8719 Personal history of other diseases of the digestive system: Secondary | ICD-10-CM | POA: Insufficient documentation

## 2016-11-24 DIAGNOSIS — K219 Gastro-esophageal reflux disease without esophagitis: Secondary | ICD-10-CM | POA: Insufficient documentation

## 2016-11-24 DIAGNOSIS — Z79899 Other long term (current) drug therapy: Secondary | ICD-10-CM | POA: Insufficient documentation

## 2016-11-24 DIAGNOSIS — R1013 Epigastric pain: Secondary | ICD-10-CM | POA: Insufficient documentation

## 2016-11-24 DIAGNOSIS — M545 Low back pain: Secondary | ICD-10-CM | POA: Insufficient documentation

## 2016-11-24 DIAGNOSIS — R739 Hyperglycemia, unspecified: Secondary | ICD-10-CM | POA: Insufficient documentation

## 2016-11-24 DIAGNOSIS — G8929 Other chronic pain: Secondary | ICD-10-CM | POA: Insufficient documentation

## 2016-11-24 LAB — POCT GLYCOSYLATED HEMOGLOBIN (HGB A1C): HEMOGLOBIN A1C: 5.4

## 2016-11-24 MED ORDER — METHOCARBAMOL 500 MG PO TABS: 500.0000 mg | ORAL_TABLET | Freq: Three times a day (TID) | ORAL | 0 refills | Status: DC

## 2016-11-24 MED ORDER — NAPROXEN 500 MG PO TABS: 500.0000 mg | ORAL_TABLET | Freq: Two times a day (BID) | ORAL | 0 refills | Status: AC

## 2016-11-24 MED ORDER — LISINOPRIL 10 MG PO TABS: 10.0000 mg | ORAL_TABLET | Freq: Every day | ORAL | 0 refills | Status: DC

## 2016-11-24 MED ORDER — RANITIDINE HCL 150 MG PO CAPS: 150.0000 mg | ORAL_CAPSULE | Freq: Every day | ORAL | 3 refills | Status: DC

## 2016-11-24 MED ORDER — HYDROCHLOROTHIAZIDE 25 MG PO TABS: 25.0000 mg | ORAL_TABLET | Freq: Every day | ORAL | 1 refills | Status: DC

## 2016-11-24 MED FILL — NAPROXEN 500 MG TABLET: 500 | 30 days supply | Qty: 60 | Fill #0

## 2016-11-24 MED FILL — raNITIdine HCL 150 MG TABS: 150 | 30 days supply | Qty: 30 | Fill #0

## 2016-11-24 MED FILL — HYDROCHLOROTHIAZIDE 25 MG T: 25 | 30 days supply | Qty: 30 | Fill #0

## 2016-11-24 MED FILL — LISINOPRIL 10 MG TABS: 10 | 30 days supply | Qty: 30 | Fill #0

## 2016-11-24 MED FILL — METHOCARBAMOL 500 MG TABS: 500 | 30 days supply | Qty: 90 | Fill #0

## 2016-11-24 NOTE — Progress Notes (Signed)
Patient ID: Denise Murphy, female   DOB: November 17, 1980, 36 y.o.   MRN: [id number]   Denise Murphy, is a 36 y.o. female  NWG:[id number]  [id number]:[id number]  DOB - 1980-08-10  Subjective:  Chief Complaint and HPI: Denise Murphy is a 36 y.o. female here today to establish care and for a follow up visit After being seen in the ED 11/11/2016 for B low back pain.  She was prescribed naproxen and robaxin in addition to prednisone and zantac. No imaging was done.  She was seen again in the ED 11/16/2016 for hypertensive urgency and syncope.  She was c/o headache.  CT head was negative.  Lisinopril and HCTZ were started.  EKG showed sinus tachycardia w/o acute changes.  CXR  WNL.  Today she presents feeling much better and BP is improving.  Pulse always elevated at doctor's office, but runs bt 70-90 at home.  HAs improving.  No CP.  Compliant with meds.    Also c/o long-time reflux and heartburn with h/o stomach ulcers.  She was restarted on zantac in the ED which is only providing minimal relief.  She only started taking it a few days ago.   LBP has improved.  Naproxen and methocarbamol helped and she would like enough to take prn  +FH-htn  ED/Hospital notes reviewed.     ROS:   Constitutional:  No f/c, No night sweats, No unexplained weight loss. EENT:  No vision changes, No blurry vision, No hearing changes. No mouth, throat, or ear problems.  Respiratory: No cough, No SOB Cardiac: No CP, no palpitations GI:  No abd pain, No N/V/D. GU: No Urinary s/sx Musculoskeletal: No joint pain Neuro: No headache, no dizziness, no motor weakness.  Skin: No rash Endocrine:  No polydipsia. No polyuria.  Psych: Denies SI/HI  No problems updated.  ALLERGIES: No Known Allergies  PAST MEDICAL HISTORY: Past Medical History:  Diagnosis Date  . Hypertension     MEDICATIONS AT HOME: Prior to Admission medications   Medication Sig Start Date End Date Taking? Authorizing Provider  hydrochlorothiazide  (HYDRODIURIL) 25 MG tablet Take 1 tablet (25 mg total) by mouth daily. 11/24/16  Yes Georgian Co M, PA-C  lisinopril (PRINIVIL,ZESTRIL) 10 MG tablet Take 1 tablet (10 mg total) by mouth daily. 11/24/16  Yes Anjelika Ausburn, Marzella Schlein, PA-C  methocarbamol (ROBAXIN) 500 MG tablet Take 1 tablet (500 mg total) by mouth 3 (three) times daily. Take only if you continue to have muscle stiffness and soreness 11/24/16  Yes Anders Simmonds, PA-C  naproxen (NAPROSYN) 500 MG tablet Take 1 tablet (500 mg total) by mouth 2 (two) times daily with a meal. 11/24/16 12/08/16 Yes Harryette Shuart M, PA-C  ranitidine (ZANTAC) 150 MG capsule Take 1 capsule (150 mg total) by mouth daily. 11/24/16  Yes Anders Simmonds, PA-C     Objective:  EXAM:   Vitals:   11/24/16 1100  BP: 139/85  Pulse: (!) 106  Resp: 18  Temp: 98.4 F (36.9 C)  TempSrc: Oral  SpO2: 98%  Weight: 182 lb (82.6 kg)  Height:  (1.702 m)    General appearance : A&OX3. NAD. Non-toxic-appearing HEENT: Atraumatic and Normocephalic.  PERRLA. EOM intact.  Neck: supple, no JVD. No cervical lymphadenopathy. No thyromegaly Chest/Lungs:  Breathing-non-labored, Good air entry bilaterally, breath sounds normal without rales, rhonchi, or wheezing  CVS: S1 S2 regular, no murmurs, gallops, rubs  Abdomen: Bowel sounds present, Non tender and not distended with no gaurding, rigidity or rebound. Extremities: Bilateral Lower  Ext shows no edema, both legs are warm to touch with = pulse throughout Neurology:  CN II-XII grossly intact, Non focal.   Psych:  TP linear. J/I WNL. Normal speech. Appropriate eye contact and affect.  Skin:  No Rash  Data Review Lab Results  Component Value Date   HGBA1C 5.4 11/24/2016     Assessment & Plan   1. Hyperglycemia No diabetes - POCT glycosylated hemoglobin (Hb A1C)  2. Chronic bilateral low back pain without sciatica - naproxen (NAPROSYN) 500 MG tablet; Take 1 tablet (500 mg total) by mouth 2 (two) times daily  with a meal.  Dispense: 60 tablet; Refill: 0 - methocarbamol (ROBAXIN) 500 MG tablet; Take 1 tablet (500 mg total) by mouth 3 (three) times daily. Take only if you continue to have muscle stiffness and soreness  Dispense: 90 tablet; Refill: 0  3. Hypertension, unspecified type Improving control-check BP daily and record and bring to next visit - Basic metabolic panel continue- lisinopril (PRINIVIL,ZESTRIL) 10 MG tablet; Take 1 tablet (10 mg total) by mouth daily.  Dispense: 90 tablet; Refill: 0 - hydrochlorothiazide (HYDRODIURIL) 25 MG tablet; Take 1 tablet (25 mg total) by mouth daily.  Dispense: 90 tablet; Refill: 1  4. Epigastric pain - H. pylori breath test - ranitidine (ZANTAC) 150 MG capsule; Take 1 capsule (150 mg total) by mouth daily.  Dispense: 30 capsule; Refill: 3   Patient have been counseled extensively about nutrition and exercise  Return in about 4 weeks (around 12/22/2016) for assign PCP; recheck htn and epigastric pain.  The patient was given clear instructions to go to ER or return to medical center if symptoms don't improve, worsen or new problems develop. The patient verbalized understanding. The patient was told to call to get lab results if they haven't heard anything in the next week.     Georgian Co, PA-C [facility] [place], [place] [phone]   11/24/2016, 11:06 AM

## 2016-11-24 NOTE — Patient Instructions (Signed)
Check BP daily and record and bring to next visit.   

## 2016-11-25 LAB — BASIC METABOLIC PANEL
BUN / CREAT RATIO: 16 (ref 9–23)
BUN: 14 mg/dL (ref 6–20)
CALCIUM: 10.4 mg/dL — AB (ref 8.7–10.2)
CHLORIDE: 98 mmol/L (ref 96–106)
CO2: 24 mmol/L (ref 20–29)
CREATININE: 0.87 mg/dL (ref 0.57–1.00)
GFR calc non Af Amer: 88 mL/min/{1.73_m2} (ref >59)
GFR, EST AFRICAN AMERICAN: 101 mL/min/{1.73_m2} (ref >59)
Glucose: 104 mg/dL — ABNORMAL HIGH (ref 65–99)
Potassium: 4.2 mmol/L (ref 3.5–5.2)
Sodium: 138 mmol/L (ref 134–144)

## 2016-11-26 ENCOUNTER — Telehealth: Payer: Self-pay | Admitting: *Deleted

## 2016-11-26 LAB — H. PYLORI BREATH TEST: H pylori Breath Test: NEGATIVE

## 2016-11-26 NOTE — Telephone Encounter (Signed)
-----   Message from Anders Simmonds, [place] sent at 11/25/2016  9:57 AM EDT ----- Please call patient.  Her labs are normal.  Follow-up as planned. Thanks, Georgian Co, PA-C

## 2016-11-26 NOTE — Telephone Encounter (Signed)
Patient verified DOB Patient is aware of labs coming back normal and advised to follow up as planned. No further questions at this time.

## 2016-12-01 ENCOUNTER — Telehealth: Payer: Self-pay | Admitting: *Deleted

## 2016-12-01 NOTE — Telephone Encounter (Signed)
Patient verified DOB Patient is aware of H Pylori being negative and to follow up as planned. No further questions at this time.

## 2016-12-01 NOTE — Telephone Encounter (Signed)
-----   Message from Anders Simmonds, [place] sent at 11/30/2016  4:10 PM EDT ----- Please call patient.  Her test for the bacteria for stomach ulcers was negative.  Follow up as planned. Thanks, Georgian Co, PA-C

## 2016-12-02 ENCOUNTER — Ambulatory Visit: Payer: Self-pay | Attending: Family Medicine

## 2016-12-16 ENCOUNTER — Emergency Department (HOSPITAL_COMMUNITY)
Admission: EM | Admit: 2016-12-16 | Discharge: 2016-12-17 | Disposition: A | Discharge status: Home / Self Care | Payer: Self-pay | Attending: Emergency Medicine | Admitting: Emergency Medicine

## 2016-12-16 ENCOUNTER — Emergency Department (HOSPITAL_COMMUNITY): Payer: Self-pay

## 2016-12-16 DIAGNOSIS — E876 Hypokalemia: Secondary | ICD-10-CM | POA: Insufficient documentation

## 2016-12-16 DIAGNOSIS — I10 Essential (primary) hypertension: Secondary | ICD-10-CM | POA: Insufficient documentation

## 2016-12-16 DIAGNOSIS — N200 Calculus of kidney: Secondary | ICD-10-CM | POA: Insufficient documentation

## 2016-12-16 LAB — CBC WITH DIFFERENTIAL/PLATELET
BASOS ABS: 0 10*3/uL (ref 0.0–0.1)
BASOS PCT: 0 %
EOS PCT: 1 %
Eosinophils Absolute: 0.1 10*3/uL (ref 0.0–0.7)
HEMATOCRIT: 40.8 % (ref 36.0–46.0)
Hemoglobin: 14.1 g/dL (ref 12.0–15.0)
LYMPHS PCT: 21 %
Lymphs Abs: 2.3 10*3/uL (ref 0.7–4.0)
MCH: 28 pg (ref 26.0–34.0)
MCHC: 34.6 g/dL (ref 30.0–36.0)
MCV: 81.1 fL (ref 78.0–100.0)
Monocytes Absolute: 0.8 10*3/uL (ref 0.1–1.0)
Monocytes Relative: 7 %
NEUTROS ABS: 7.8 10*3/uL — AB (ref 1.7–7.7)
Neutrophils Relative %: 71 %
PLATELETS: 236 10*3/uL (ref 150–400)
RBC: 5.03 MIL/uL (ref 3.87–5.11)
RDW: 13.9 % (ref 11.5–15.5)
WBC: 10.9 10*3/uL — AB (ref 4.0–10.5)

## 2016-12-16 LAB — COMPREHENSIVE METABOLIC PANEL
ALT: 77 U/L — ABNORMAL HIGH (ref 14–54)
ANION GAP: 15 (ref 5–15)
AST: 33 U/L (ref 15–41)
Albumin: 4.8 g/dL (ref 3.5–5.0)
Alkaline Phosphatase: 56 U/L (ref 38–126)
BILIRUBIN TOTAL: 0.8 mg/dL (ref 0.3–1.2)
BUN: 19 mg/dL (ref 6–20)
CO2: 23 mmol/L (ref 22–32)
Calcium: 9.5 mg/dL (ref 8.9–10.3)
Chloride: 99 mmol/L — ABNORMAL LOW (ref 101–111)
Creatinine, Ser: 0.86 mg/dL (ref 0.44–1.00)
GFR calc Af Amer: >60 mL/min (ref >60)
Glucose, Bld: 120 mg/dL — ABNORMAL HIGH (ref 65–99)
POTASSIUM: 2.7 mmol/L — AB (ref 3.5–5.1)
Sodium: 137 mmol/L (ref 135–145)
TOTAL PROTEIN: 8.4 g/dL — AB (ref 6.5–8.1)

## 2016-12-16 LAB — URINALYSIS, ROUTINE W REFLEX MICROSCOPIC
Bacteria, UA: NONE SEEN
Bilirubin Urine: NEGATIVE
GLUCOSE, UA: NEGATIVE mg/dL
KETONES UR: NEGATIVE mg/dL
LEUKOCYTES UA: NEGATIVE
NITRITE: NEGATIVE
PROTEIN: NEGATIVE mg/dL
Specific Gravity, Urine: 1.013 (ref 1.005–1.030)
pH: 6 (ref 5.0–8.0)

## 2016-12-16 LAB — LIPASE, BLOOD: Lipase: 41 U/L (ref 11–51)

## 2016-12-16 LAB — PREGNANCY, URINE: Preg Test, Ur: NEGATIVE

## 2016-12-16 MED ORDER — IOPAMIDOL (ISOVUE-300) INJECTION 61%
INTRAVENOUS | Status: AC
  Administered 2016-12-17: 100 mL via INTRAVENOUS
  Filled 2016-12-16: qty 100

## 2016-12-16 MED ORDER — ONDANSETRON HCL 4 MG/2ML IJ SOLN
4.0000 mg | Freq: Once | INTRAMUSCULAR | Status: AC
  Administered 2016-12-16: 4 mg via INTRAVENOUS
  Filled 2016-12-16: qty 2

## 2016-12-16 MED ORDER — MORPHINE SULFATE (PF) 4 MG/ML IV SOLN
4.0000 mg | Freq: Once | INTRAVENOUS | Status: AC
  Administered 2016-12-16: 4 mg via INTRAVENOUS
  Filled 2016-12-16: qty 1

## 2016-12-16 MED ORDER — SODIUM CHLORIDE 0.9 % IV BOLUS (SEPSIS)
1000.0000 mL | Freq: Once | INTRAVENOUS | Status: AC
  Administered 2016-12-16: 1000 mL via INTRAVENOUS

## 2016-12-16 MED ORDER — POTASSIUM CHLORIDE CRYS ER 20 MEQ PO TBCR
40.0000 meq | EXTENDED_RELEASE_TABLET | Freq: Once | ORAL | Status: AC
  Administered 2016-12-16: 40 meq via ORAL
  Filled 2016-12-16: qty 2

## 2016-12-16 NOTE — ED Notes (Signed)
Bed: [id number] Expected date:  Expected time:  Means of arrival:  Comments: EMS 36 yo female left and right flank pain intermittently last 4 days 9/10 Fentanyl 50 mcg

## 2016-12-16 NOTE — ED Triage Notes (Signed)
Pt BIB [facility] from urgent care. Pt c/o bilateral flank pain, severe in nature 9/10. Movement makes the pain worse. LMP 2 weeks ago. No problems urinating or complaints. Pt is CAOx4, pink/dry. Given Fentanyl 200mcg en route. Pain now 6/10. Pt no hx of kidney stones.

## 2016-12-16 NOTE — ED Provider Notes (Signed)
[facility]-EMERGENCY DEPT Provider Note   CSN: [id number] Arrival date & time: 12/16/16  1923     History   Chief Complaint Chief Complaint  Patient presents with  . Flank Pain    HPI Denise Murphy is a 36 y.o. female with a history of hypertension presents today for evaluation of bilateral flank pain. She reports that her pain has been worsening over the past 4 days. She says that she had a similar episode of pain approximately 1 month ago which resolved. She reports severe flank pain, right worse than left. She reports chills last night. She reports that the pain is so bad it causes her to vomit. She has had decreased appetite recently.  She feels like her pain is sharp, wraps around from her back to her front.  She denies any recent trauma.  She denies possibility of pregnancy stating she has her tubes tied.   She denies any vaginal discharge or concerns. She initially presented to urgent care who sent her here.    HPI  Past Medical History:  Diagnosis Date  . Hypertension     There are no active problems to display for this patient.   Past Surgical History:  Procedure Laterality Date  . TUBAL LIGATION      OB History    No data available       Home Medications    Prior to Admission medications   Medication Sig Start Date End Date Taking? Authorizing Provider  hydrochlorothiazide (HYDRODIURIL) 25 MG tablet Take 1 tablet (25 mg total) by mouth daily. 11/24/16  Yes Georgian CoMcClung, Angela M, PA-C  lisinopril (PRINIVIL,ZESTRIL) 10 MG tablet Take 1 tablet (10 mg total) by mouth daily. 11/24/16  Yes McClung, Marzella SchleinAngela M, PA-C  methocarbamol (ROBAXIN) 500 MG tablet Take 1 tablet (500 mg total) by mouth 3 (three) times daily. Take only if you continue to have muscle stiffness and soreness Patient taking differently: Take 500 mg by mouth every 8 (eight) hours as needed for muscle spasms. Take only if you continue to have muscle stiffness and soreness 11/24/16  Yes  Anders SimmondsMcClung, Angela M, PA-C  ranitidine (ZANTAC) 150 MG capsule Take 1 capsule (150 mg total) by mouth daily. 11/24/16  Yes Anders SimmondsMcClung, Angela M, PA-C    Family History No family history on file.  Social History Social History  Substance Use Topics  . Smoking status: Never Smoker  . Smokeless tobacco: Never Used  . Alcohol use Yes     Comment: occ     Allergies   Patient has no known allergies.   Review of Systems Review of Systems  Constitutional: Positive for chills. Negative for fever.  HENT: Negative for ear pain and sore throat.   Eyes: Negative for pain and visual disturbance.  Respiratory: Negative for cough and shortness of breath.   Cardiovascular: Negative for chest pain and palpitations.  Gastrointestinal: Positive for abdominal pain, nausea and vomiting.  Genitourinary: Positive for flank pain. Negative for dysuria, hematuria, urgency, vaginal bleeding, vaginal discharge and vaginal pain.  Musculoskeletal: Positive for back pain. Negative for arthralgias.  Skin: Negative for color change and rash.  Neurological: Negative for seizures and syncope.  All other systems reviewed and are negative.    Physical Exam Updated Vital Signs BP (!) 134/92 (BP Location: Right Arm) Comment: Simultaneous filing. User may not have seen previous data.  Pulse 75 Comment: Simultaneous filing. User may not have seen previous data.  Temp 98 F (36.7 C) (Oral)   Resp  15   SpO2 100% Comment: Simultaneous filing. User may not have seen previous data.  Physical Exam  Constitutional: She appears well-developed and well-nourished.  Appears uncomfortable  HENT:  Head: Normocephalic and atraumatic.  Eyes: Conjunctivae are normal.  Neck: Normal range of motion. Neck supple.  Cardiovascular: Normal rate and regular rhythm.   No murmur heard. Pulmonary/Chest: Effort normal and breath sounds normal. No respiratory distress.  Abdominal: Soft. Bowel sounds are normal. She exhibits no  distension. There is no tenderness. There is CVA tenderness. There is no rigidity and no guarding.  Musculoskeletal: She exhibits no edema.  Lumbar paraspinal muscles are tight, tender, however does not recreate or exacerbate her pain.   Neurological: She is alert.  Skin: Skin is warm and dry. She is not diaphoretic.  Psychiatric: She has a normal mood and affect.  Nursing note and vitals reviewed.    ED Treatments / Results  Labs (all labs ordered are listed, but only abnormal results are displayed) Labs Reviewed  COMPREHENSIVE METABOLIC PANEL - Abnormal; Notable for the following:       Result Value   Potassium 2.7 (*)    Chloride 99 (*)    Glucose, Bld 120 (*)    Total Protein 8.4 (*)    ALT 77 (*)    All other components within normal limits  CBC WITH DIFFERENTIAL/PLATELET - Abnormal; Notable for the following:    WBC 10.9 (*)    Neutro Abs 7.8 (*)    All other components within normal limits  URINALYSIS, ROUTINE W REFLEX MICROSCOPIC - Abnormal; Notable for the following:    Hgb urine dipstick MODERATE (*)    Squamous Epithelial / LPF 6-30 (*)    All other components within normal limits  LIPASE, BLOOD  I-STAT BETA HCG BLOOD, ED ([facility], [facility], [facility] ONLY)    EKG  EKG Interpretation None       Radiology No results found.  Procedures Procedures (including critical care time)  Medications Ordered in ED Medications  potassium chloride SA (K-DUR,KLOR-CON) CR tablet 40 mEq (not administered)  morphine 4 MG/ML injection 4 mg (4 mg Intravenous Given 12/16/16 2130)  ondansetron (ZOFRAN) injection 4 mg (4 mg Intravenous Given 12/16/16 2130)  sodium chloride 0.9 % bolus 1,000 mL (1,000 mLs Intravenous New Bag/Given 12/16/16 2130)     Initial Impression / Assessment and Plan / ED Course  I have reviewed the triage vital signs and the nursing notes.  Pertinent labs & imaging results that were available during my care of the patient were reviewed by me and considered in my  medical decision making (see chart for details).  Clinical Course as of Dec 17 2318  Wed Dec 16, 2016  2157 Patient was reassessed, she is currently resting comfortably. Reports that her pain is down to a 3 out of 10 and her nausea has resolved.  [EH]    Clinical Course User Index [EH] Cristina Gong, PA-C   At shift change care was transferred to [facility] who will follow pending studies, re-evaulate and determine disposition.     Final Clinical Impressions(s) / ED Diagnoses   Final diagnoses:  None    New Prescriptions New Prescriptions   No medications on file     Norman Clay 12/16/16 2321    Linwood Dibbles, MD 12/17/16 [phone]

## 2016-12-17 ENCOUNTER — Encounter (HOSPITAL_COMMUNITY): Payer: Self-pay

## 2016-12-17 MED ORDER — POTASSIUM CHLORIDE CRYS ER 20 MEQ PO TBCR: 20.0000 meq | EXTENDED_RELEASE_TABLET | Freq: Two times a day (BID) | ORAL | 0 refills | Status: DC

## 2016-12-17 MED ORDER — ONDANSETRON HCL 4 MG PO TABS: 4.0000 mg | ORAL_TABLET | Freq: Four times a day (QID) | ORAL | 0 refills | Status: DC

## 2016-12-17 MED ORDER — IOPAMIDOL (ISOVUE-300) INJECTION 61%
100.0000 mL | Freq: Once | INTRAVENOUS | Status: AC | PRN
  Administered 2016-12-17: 100 mL via INTRAVENOUS

## 2016-12-17 MED ORDER — HYDROMORPHONE HCL 1 MG/ML IJ SOLN
1.0000 mg | Freq: Once | INTRAMUSCULAR | Status: AC
  Administered 2016-12-17: 1 mg via INTRAVENOUS
  Filled 2016-12-17: qty 1

## 2016-12-17 MED ORDER — HYDROCODONE-ACETAMINOPHEN 5-325 MG PO TABS: 2.0000 | ORAL_TABLET | ORAL | 0 refills | Status: AC | PRN

## 2016-12-17 NOTE — ED Provider Notes (Signed)
Patient signed out to me at shift change pending CT.  CT shows evidence of kidney stone.  Pain is controlled.  No evidence of UTI.  Treat pain and replete potassium.    1:06 AM Feels much improved. Urology follow-up.  Patient understands and agrees with the plan.   Roxy HorsemanBrowning, Maleigha Colvard, PA-C 12/17/16 Robynn [id number]    Linwood DibblesKnapp, Jon, MD 12/17/16 [phone]

## 2016-12-18 ENCOUNTER — Emergency Department (HOSPITAL_BASED_OUTPATIENT_CLINIC_OR_DEPARTMENT_OTHER): Payer: Self-pay

## 2016-12-18 ENCOUNTER — Encounter (HOSPITAL_BASED_OUTPATIENT_CLINIC_OR_DEPARTMENT_OTHER): Payer: Self-pay | Admitting: Emergency Medicine

## 2016-12-18 ENCOUNTER — Emergency Department (HOSPITAL_BASED_OUTPATIENT_CLINIC_OR_DEPARTMENT_OTHER)
Admission: EM | Admit: 2016-12-18 | Discharge: 2016-12-18 | Disposition: A | Discharge status: Home / Self Care | Payer: Self-pay | Attending: Emergency Medicine | Admitting: Emergency Medicine

## 2016-12-18 DIAGNOSIS — R1032 Left lower quadrant pain: Secondary | ICD-10-CM

## 2016-12-18 DIAGNOSIS — N200 Calculus of kidney: Secondary | ICD-10-CM | POA: Insufficient documentation

## 2016-12-18 DIAGNOSIS — Z79899 Other long term (current) drug therapy: Secondary | ICD-10-CM | POA: Insufficient documentation

## 2016-12-18 DIAGNOSIS — I10 Essential (primary) hypertension: Secondary | ICD-10-CM | POA: Insufficient documentation

## 2016-12-18 LAB — URINALYSIS, ROUTINE W REFLEX MICROSCOPIC
Bilirubin Urine: NEGATIVE
Glucose, UA: NEGATIVE mg/dL
Ketones, ur: NEGATIVE mg/dL
LEUKOCYTES UA: NEGATIVE
NITRITE: NEGATIVE
PROTEIN: NEGATIVE mg/dL
SPECIFIC GRAVITY, URINE: 1.01 (ref 1.005–1.030)
pH: 7 (ref 5.0–8.0)

## 2016-12-18 LAB — CBC WITH DIFFERENTIAL/PLATELET
BASOS ABS: 0.1 10*3/uL (ref 0.0–0.1)
Basophils Relative: 0 %
EOS ABS: 0 10*3/uL (ref 0.0–0.7)
EOS PCT: 0 %
HCT: 36.6 % (ref 36.0–46.0)
Hemoglobin: 12.3 g/dL (ref 12.0–15.0)
LYMPHS PCT: 12 %
Lymphs Abs: 1.3 10*3/uL (ref 0.7–4.0)
MCH: 27.6 pg (ref 26.0–34.0)
MCHC: 33.6 g/dL (ref 30.0–36.0)
MCV: 82.1 fL (ref 78.0–100.0)
Monocytes Absolute: 0.8 10*3/uL (ref 0.1–1.0)
Monocytes Relative: 7 %
Neutro Abs: 9 10*3/uL — ABNORMAL HIGH (ref 1.7–7.7)
Neutrophils Relative %: 81 %
PLATELETS: 169 10*3/uL (ref 150–400)
RBC: 4.46 MIL/uL (ref 3.87–5.11)
RDW: 14.2 % (ref 11.5–15.5)
WBC: 11.1 10*3/uL — AB (ref 4.0–10.5)

## 2016-12-18 LAB — COMPREHENSIVE METABOLIC PANEL
ALT: 54 U/L (ref 14–54)
AST: 28 U/L (ref 15–41)
Albumin: 4.1 g/dL (ref 3.5–5.0)
Alkaline Phosphatase: 43 U/L (ref 38–126)
Anion gap: 7 (ref 5–15)
BUN: 11 mg/dL (ref 6–20)
CHLORIDE: 108 mmol/L (ref 101–111)
CO2: 22 mmol/L (ref 22–32)
CREATININE: 0.91 mg/dL (ref 0.44–1.00)
Calcium: 8.4 mg/dL — ABNORMAL LOW (ref 8.9–10.3)
GFR calc Af Amer: >60 mL/min (ref >60)
GFR calc non Af Amer: >60 mL/min (ref >60)
Glucose, Bld: 90 mg/dL (ref 65–99)
Potassium: 3.9 mmol/L (ref 3.5–5.1)
SODIUM: 137 mmol/L (ref 135–145)
Total Bilirubin: 0.6 mg/dL (ref 0.3–1.2)
Total Protein: 7 g/dL (ref 6.5–8.1)

## 2016-12-18 LAB — URINALYSIS, MICROSCOPIC (REFLEX)

## 2016-12-18 MED ORDER — OXYCODONE-ACETAMINOPHEN 5-325 MG PO TABS: 1.0000 | ORAL_TABLET | Freq: Four times a day (QID) | ORAL | 0 refills | Status: DC | PRN

## 2016-12-18 MED ORDER — TAMSULOSIN HCL 0.4 MG PO CAPS: 0.4000 mg | ORAL_CAPSULE | Freq: Every day | ORAL | 0 refills | Status: DC

## 2016-12-18 MED ORDER — MORPHINE SULFATE (PF) 4 MG/ML IV SOLN
4.0000 mg | Freq: Once | INTRAVENOUS | Status: AC
  Administered 2016-12-18: 4 mg via INTRAVENOUS
  Filled 2016-12-18: qty 1

## 2016-12-18 MED ORDER — TAMSULOSIN HCL 0.4 MG PO CAPS
0.4000 mg | ORAL_CAPSULE | Freq: Once | ORAL | Status: AC
Start: 2016-12-18 — End: 2016-12-18
  Administered 2016-12-18: 0.4 mg via ORAL
  Filled 2016-12-18: qty 1

## 2016-12-18 MED ORDER — SODIUM CHLORIDE 0.9 % IV BOLUS (SEPSIS): 1000.0000 mL | Freq: Once | INTRAVENOUS | Status: DC

## 2016-12-18 MED ORDER — SODIUM CHLORIDE 0.9 % IV BOLUS (SEPSIS)
1000.0000 mL | Freq: Once | INTRAVENOUS | Status: AC
  Administered 2016-12-18: 1000 mL via INTRAVENOUS

## 2016-12-18 MED ORDER — FENTANYL CITRATE (PF) 100 MCG/2ML IJ SOLN
50.0000 ug | INTRAMUSCULAR | Status: DC | PRN
Start: 2016-12-18 — End: 2016-12-18
  Administered 2016-12-18: 50 ug via INTRAVENOUS
  Filled 2016-12-18: qty 2

## 2016-12-18 MED ORDER — HYDROMORPHONE HCL 1 MG/ML IJ SOLN
1.0000 mg | Freq: Once | INTRAMUSCULAR | Status: AC
  Administered 2016-12-18: 1 mg via INTRAVENOUS
  Filled 2016-12-18: qty 1

## 2016-12-18 MED ORDER — KETOROLAC TROMETHAMINE 30 MG/ML IJ SOLN
INTRAMUSCULAR | Status: AC
Start: 2016-12-18 — End: 2016-12-18
  Administered 2016-12-18: 30 mg via INTRAVENOUS
  Filled 2016-12-18: qty 1

## 2016-12-18 MED ORDER — ONDANSETRON HCL 4 MG/2ML IJ SOLN
4.0000 mg | Freq: Once | INTRAMUSCULAR | Status: AC
  Administered 2016-12-18: 4 mg via INTRAVENOUS
  Filled 2016-12-18: qty 2

## 2016-12-18 MED ORDER — KETOROLAC TROMETHAMINE 30 MG/ML IJ SOLN
30.0000 mg | Freq: Once | INTRAMUSCULAR | Status: AC
  Administered 2016-12-18: 30 mg via INTRAVENOUS

## 2016-12-18 MED FILL — OXYCODONE-ACETAMINOPHEN 5-3: 5-325 | 2 days supply | Qty: 6 | Fill #0

## 2016-12-18 MED FILL — TAMSULOSIN HCL 0.4 MG CAP: 0.4 | 10 days supply | Qty: 10 | Fill #0

## 2016-12-18 NOTE — ED Notes (Addendum)
Patient transported to us 

## 2016-12-18 NOTE — Discharge Instructions (Signed)
Please see the information and instructions below regarding your visit.  Your diagnoses today include:  1. Nephrolithiasis   2. LLQ pain    Your kidney stone is currently located at the location where the tube from your kidney meets the bladder.  This can cause pain as urine backs up.  Your ultrasound was reassuring today that there is no further backing up of urine into your kidney.  Your pelvic ultrasound was reassuring today that there is no turning or flipping of your ovary.  Tests performed today include: See side panel of your discharge paperwork for testing performed today. Vital signs are listed at the bottom of these instructions.  -Blood counts -Electrolytes -Kidney function -Urine -Kidney ultrasound -Pelvic ultrasound  Medications prescribed:    Take any prescribed medications only as prescribed, and any over the counter medications only as directed on the packaging.  Flomax.  This is a medication that will help open up your tubes in your urinary system to help the stone pass.  The  You have been prescribed Percocet for pain. This is an opioid pain medication. You may take this medication every 4-6 hours as needed for pain. Only take this medication if you need it for breakthrough pain. You may combine this medicine with ibuporfen, a non-steroidal anti-inflammatory drug (NSAID) every 6 hours, so you are getting something for pain relief every 3 hours.  Do not combine this medication with Tylenol, as it may increase the risk of liver problems.  Do not combine this medication with alcohol.  Please be advised to avoid driving or operating heavy machinery while taking this medication, as it may make you drowsy or impair judgment.    Home care instructions:  Please follow any educational materials contained in this packet.   Follow-up instructions: Please follow-up with your primary care provider as soon as possible for further evaluation of your symptoms if they are not  completely improved.   Please follow up with Urology.  Return instructions:  Please return to the Emergency Department if you experience worsening symptoms.  Please return to the emergency department for nausea and vomiting that prevents you for keeping anything down, fevers with your abdominal pain, or abdominal pain that is moving in quality or location. Please return if you have any other emergent concerns.  Additional Information:   Your vital signs today were: BP (!) 148/85 (BP Location: Right Arm)    Pulse 81    Temp 98.6 F (37 C) (Oral)    Resp 20    Wt 82.6 kg (182 lb)    LMP 12/04/2016    SpO2 98%    BMI 28.51 kg/m  If your blood pressure (BP) was elevated on multiple readings during this visit above 130 for the top number or above 80 for the bottom number, please have this repeated by your primary care provider within one month. --------------  Thank you for allowing us to participate in your care today.

## 2016-12-18 NOTE — ED Provider Notes (Signed)
[facility] EMERGENCY DEPARTMENT Provider Note   CSN: [id number] Arrival date & time: 12/18/16  2130     History   Chief Complaint Chief Complaint  Patient presents with  . Flank Pain    HPI Denise Murphy is a 36 y.o. female.  HPI   Patient is a 36 year old female with abdominal pain and flank pain for approximately 4-5 days and know kidney stone. Patient was seen 2 days ago for colicky abdominal pain and flank pain when this diagnosis was made. Patient reports that she has been taking Norco for pain control, but it did not touch the pain this morning and she had an exacerbation of pain worse than initial presentation. Pain is located along left side of abdomen, left flank, and across lower abdomen. Patient reports she is also very nauseous and has no appetite. Patient reports she has vomited 3 times today. Patient denies diarrhea. Patient reports she has had continued chills and night sweats, but no recorded fevers. Patient reports last menstrual period was 2 weeks ago.pregnancy test was negative yesterday. Patient denies any new sexual partners. Patient denies any abnormal vaginal discharge or vaginal bleeding.   On chart review, patient was diagnosed with 4mm kidney stone at the left UVJ on 12/17/16. CT also demonstrates mild hydroureter on the left. Free fluid noted in pelvis and probable involuting cyst on left. Patient was discharged with Norco and Zofran with oral K repletion.   Past Medical History:  Diagnosis Date  . Hypertension     There are no active problems to display for this patient.   Past Surgical History:  Procedure Laterality Date  . TUBAL LIGATION      OB History    No data available       Home Medications    Prior to Admission medications   Medication Sig Start Date End Date Taking? Authorizing Provider  hydrochlorothiazide (HYDRODIURIL) 25 MG tablet Take 1 tablet (25 mg total) by mouth daily. 11/24/16   Anders Simmonds, PA-C    HYDROcodone-acetaminophen (NORCO/VICODIN) 5-325 MG tablet Take 2 tablets by mouth every 4 (four) hours as needed. 12/17/16   Roxy Horseman, PA-C  lisinopril (PRINIVIL,ZESTRIL) 10 MG tablet Take 1 tablet (10 mg total) by mouth daily. 11/24/16   Anders Simmonds, PA-C  methocarbamol (ROBAXIN) 500 MG tablet Take 1 tablet (500 mg total) by mouth 3 (three) times daily. Take only if you continue to have muscle stiffness and soreness Patient taking differently: Take 500 mg by mouth every 8 (eight) hours as needed for muscle spasms. Take only if you continue to have muscle stiffness and soreness 11/24/16   Georgian Co M, PA-C  ondansetron (ZOFRAN) 4 MG tablet Take 1 tablet (4 mg total) by mouth every 6 (six) hours. 12/17/16   Roxy Horseman, PA-C  potassium chloride SA (K-DUR,KLOR-CON) 20 MEQ tablet Take 1 tablet (20 mEq total) by mouth 2 (two) times daily. 12/17/16   Roxy Horseman, PA-C  ranitidine (ZANTAC) 150 MG capsule Take 1 capsule (150 mg total) by mouth daily. 11/24/16   Anders Simmonds, PA-C    Family History History reviewed. No pertinent family history.  Social History Social History  Substance Use Topics  . Smoking status: Never Smoker  . Smokeless tobacco: Never Used  . Alcohol use Yes     Comment: occ     Allergies   Patient has no known allergies.   Review of Systems Review of Systems  Constitutional: Positive for appetite change and chills. Negative  for fever.  HENT: Negative for congestion, rhinorrhea and sore throat.   Eyes: Negative for visual disturbance.  Respiratory: Negative for cough, chest tightness and shortness of breath.   Cardiovascular: Negative for chest pain.  Gastrointestinal: Positive for abdominal pain, nausea and vomiting. Negative for constipation and diarrhea.  Genitourinary: Positive for decreased urine volume. Negative for dysuria, flank pain, hematuria, vaginal bleeding and vaginal discharge.  Musculoskeletal: Positive for back pain  and myalgias.  Skin: Negative for rash.  Neurological: Positive for dizziness and light-headedness. Negative for syncope and headaches.     Physical Exam Updated Vital Signs Pulse 93   Temp 98.6 F (37 C) (Oral)   Resp 18   Wt 82.6 kg (182 lb)   LMP 12/04/2016   SpO2 100%   BMI 28.51 kg/m   Physical Exam  Constitutional: She appears well-developed and well-nourished. No distress.  HENT:  Head: Normocephalic and atraumatic.  Mouth/Throat: Oropharynx is clear and moist.  Eyes: Pupils are equal, round, and reactive to light. Conjunctivae and EOM are normal.  Neck: Normal range of motion. Neck supple.  Cardiovascular: Normal rate, regular rhythm, S1 normal and S2 normal.   No murmur heard. Pulmonary/Chest: Effort normal and breath sounds normal. She has no wheezes. She has no rales.  Abdominal: Soft. She exhibits no distension. There is tenderness. There is no guarding.  CVA tenderness on left. Tenderness to palpation in the left lower quadrant.No tenderness to deep palpation of the right lower quadrant. Discomfort to palpation in LUQ and epigastrium.  Musculoskeletal: Normal range of motion. She exhibits no edema or deformity.  No midline cervical, thoracic, or lumbar pain.Bilateral paraspinal lumbar muscular tenderness.  Lymphadenopathy:    She has no cervical adenopathy.  Neurological: She is alert.  Spine Exam: Strength: 5/5 throughout LE bilaterally (hip flexion/extension, adduction/abduction; knee flexion/extension; foot dorsiflexion/plantarflexion, inversion/eversion; great toe inversion) Sensation: Intact to light touch in proximal and distal LE bilaterally Reflexes: 2+ quadriceps reflexes and symmetric.   Skin: Skin is warm and dry. No rash noted. No erythema.  Psychiatric: She has a normal mood and affect. Her behavior is normal. Judgment and thought content normal.  Nursing note and vitals reviewed.    ED Treatments / Results  Labs (all labs ordered are listed,  but only abnormal results are displayed) Labs Reviewed  URINALYSIS, ROUTINE W REFLEX MICROSCOPIC  BASIC METABOLIC PANEL  CBC    EKG  EKG Interpretation None       Radiology Ct Abdomen Pelvis W Contrast  Result Date: 12/17/2016 CLINICAL DATA:  Bilateral flank pain for 4 days, worse on the right. Nausea and vomiting. Microhematuria. EXAM: CT ABDOMEN AND PELVIS WITH CONTRAST TECHNIQUE: Multidetector CT imaging of the abdomen and pelvis was performed using the standard protocol following bolus administration of intravenous contrast. CONTRAST:  100 mL Isovue-300 COMPARISON:  None. FINDINGS: Lower chest: The lung bases are clear. Hepatobiliary: No focal liver abnormality is seen. No gallstones, gallbladder wall thickening, or biliary dilatation. Pancreas: Unremarkable. No pancreatic ductal dilatation or surrounding inflammatory changes. Spleen: Normal in size without focal abnormality. Adrenals/Urinary Tract: No adrenal gland nodules. Slightly delayed nephrogram on the left kidney. Mild left hydronephrosis and hydroureter. 4 mm stone in the distal left ureter just above the ureterovesical junction. Punctate size stone in the midpole right kidney. No hydronephrosis or hydroureter on the right. No bladder wall thickening or bladder filling defects. Stomach/Bowel: Stomach is within normal limits. Appendix appears normal. No evidence of bowel wall thickening, distention, or inflammatory changes. Vascular/Lymphatic: No  significant vascular findings are present. No enlarged abdominal or pelvic lymph nodes. Reproductive: Uterus and ovaries are not enlarged. Probable involuting cyst on the left ovary. Small amount of free fluid in the pelvis is likely physiologic. Other: No free air in the abdomen. Abdominal wall musculature appears intact. Musculoskeletal: No acute or significant osseous findings. IMPRESSION: 1. 4 mm stone in the distal left ureter with moderate proximal obstruction. 2. Nonobstructing stone in  the right kidney. 3. Physiologic free fluid in the pelvis with involuting follicle of the left ovary. Electronically Signed   By: Burman NievesWilliam  Stevens M.D.   On: 12/17/2016 00:39    Procedures Procedures (including critical care time)  Medications Ordered in ED Medications  fentaNYL (SUBLIMAZE) injection 50 mcg (50 mcg Intravenous Given 12/18/16 0954)  ondansetron (ZOFRAN) injection 4 mg (4 mg Intravenous Given 12/18/16 0954)  ketorolac (TORADOL) 30 MG/ML injection 30 mg (30 mg Intravenous Given 12/18/16 0954)     Initial Impression / Assessment and Plan / ED Course  I have reviewed the triage vital signs and the nursing notes.  Pertinent labs & imaging results that were available during my care of the patient were reviewed by me and considered in my medical decision making (see chart for details).  Clinical Course as of Dec 18 1416  Fri Dec 18, 2016  1217 Patient reevaluated. Patient feels that her pain is pain is more tolerable at this time.  BP: (!) 145/85 [AM]    Clinical Course User Index [AM] Elisha PonderMurray, Laithan Conchas B, PA-C    Final Clinical Impressions(s) / ED Diagnoses   Final diagnoses:  None   10:24 AM. Case discussed with Dr. Chaney Mallingavid Yao. Differential diagnosis includes worsening hydroureter due to nephrolithiasis, ovarian torsion, ovarian cyst rupture with referred flank pain. Patient has a history of sciatica, but is neurologically intact in the lower extremities. Do not suspect lumbar musculature or spinal pathology as cause of patient's pain. Will proceed with renal ultrasound, TVUS, and abdominal plain film to assess location of stone.  Multiple re-evaluations, patient responded well to pain and nausea control.  No change in quality or location of abdominal or flank tenderness during re-evaluations.  Laboratory work is reassuring.  Slight leukocytosis at 11.1.  This may be a stress response.  Patient demonstrates no evidence of infection on UA and her urine has trace blood.   Pelvic ultrasound performed to assess for ovarian torsion, and is reassuring, as there is no inflammation of fallopian tubes, uterus, TOA or evidence of ovarian torsion.  Renal ultrasound demonstrates continued mild hydronephrosis on the left.  Patient will be discharged with additional pain control, and Flomax.  Patient reports she still has Zofran at the pharmacy.  Return precautions given for any fever in the setting of this pain, focal abdominal tenderness changing in quality or location, or intractable nausea and vomiting.  Patient is in understanding and agrees with the plan of care.  This is a supervised visit with Dr. Chaney Mallingavid Yao. Evaluation, management, and discharge planning discussed with this attending physician.  New Prescriptions New Prescriptions   No medications on file     Delia ChimesMurray, Kalimah Capurro B, PA-C 12/18/16 1422    Charlynne PanderYao, David Hsienta, MD 12/19/16 [phone]

## 2016-12-18 NOTE — ED Notes (Signed)
Patient tolerating crackers at this time.

## 2016-12-18 NOTE — ED Triage Notes (Signed)
Patient dx with a kidney stone 4 -5 mm 2 days ago. Patinet states that the pain came back today

## 2016-12-18 NOTE — ED Notes (Signed)
Patient reports that her pain is coming back. PA aware orders placed

## 2017-01-01 ENCOUNTER — Other Ambulatory Visit: Payer: Self-pay

## 2017-01-01 ENCOUNTER — Encounter: Payer: Self-pay | Admitting: Family Medicine

## 2017-01-01 ENCOUNTER — Ambulatory Visit: Payer: Self-pay | Attending: Family Medicine | Admitting: Family Medicine

## 2017-01-01 ENCOUNTER — Ambulatory Visit: Payer: Self-pay | Admitting: Licensed Clinical Social Worker

## 2017-01-01 VITALS — BP 134/82 | HR 103 | Temp 98.6°F | Resp 18 | Ht 67.0 in | Wt 176.6 lb

## 2017-01-01 DIAGNOSIS — I10 Essential (primary) hypertension: Secondary | ICD-10-CM | POA: Insufficient documentation

## 2017-01-01 DIAGNOSIS — R002 Palpitations: Secondary | ICD-10-CM | POA: Insufficient documentation

## 2017-01-01 DIAGNOSIS — R9431 Abnormal electrocardiogram [ECG] [EKG]: Secondary | ICD-10-CM

## 2017-01-01 DIAGNOSIS — F419 Anxiety disorder, unspecified: Secondary | ICD-10-CM | POA: Insufficient documentation

## 2017-01-01 DIAGNOSIS — R82998 Other abnormal findings in urine: Secondary | ICD-10-CM

## 2017-01-01 DIAGNOSIS — K219 Gastro-esophageal reflux disease without esophagitis: Secondary | ICD-10-CM | POA: Insufficient documentation

## 2017-01-01 LAB — POCT URINALYSIS DIPSTICK
Bilirubin, UA: NEGATIVE
Blood, UA: NEGATIVE
GLUCOSE UA: NEGATIVE
KETONES UA: NEGATIVE
Leukocytes, UA: NEGATIVE
Nitrite, UA: NEGATIVE
Protein, UA: NEGATIVE
SPEC GRAV UA: 1.02 (ref 1.010–1.025)
Urobilinogen, UA: 0.2 E.U./dL
pH, UA: 7 (ref 5.0–8.0)

## 2017-01-01 MED ORDER — LISINOPRIL 10 MG PO TABS: 10.0000 mg | ORAL_TABLET | Freq: Every day | ORAL | 1 refills | Status: DC

## 2017-01-01 MED ORDER — RANITIDINE HCL 150 MG PO CAPS: 150.0000 mg | ORAL_CAPSULE | Freq: Two times a day (BID) | ORAL | 5 refills | Status: AC

## 2017-01-01 MED ORDER — HYDROXYZINE HCL 10 MG PO TABS: 10.0000 mg | ORAL_TABLET | Freq: Three times a day (TID) | ORAL | 0 refills | Status: AC | PRN

## 2017-01-01 MED ORDER — HYDROCHLOROTHIAZIDE 25 MG PO TABS: 25.0000 mg | ORAL_TABLET | Freq: Every day | ORAL | 1 refills | Status: DC

## 2017-01-01 MED FILL — LISINOPRIL 10 MG TABS: 10 | 30 days supply | Qty: 30 | Fill #0

## 2017-01-01 MED FILL — HYDROCHLOROTHIAZIDE 25 MG T: 25 | 30 days supply | Qty: 30 | Fill #0

## 2017-01-01 MED FILL — raNITIdine HCL 150 MG TABS: 150 | 30 days supply | Qty: 60 | Fill #0

## 2017-01-01 NOTE — Patient Instructions (Signed)

## 2017-01-01 NOTE — Progress Notes (Signed)
Subjective:  Patient ID: Denise Murphy, female    DOB: 1980/11/14  Age: 36 y.o. MRN: [id number]  CC: Hypertension   HPI Denise Murphy presents for HTN.  She is not exercising and is adherent to low salt diet.  She does not check BP at home. Cardiac symptoms palpitations. Patient denies chest pain, chest pressure/discomfort, dyspnea, near-syncope and syncope.  Cardiovascular risk factors: hypertension and sedentary lifestyle. Use of agents associated with hypertension: none. History of target organ damage: none. She does report being told she had hyperthyroidism over 3 years ago. She denies taking anything for symptoms. She also reports history of ED visit a few months ago related to uncontrolled HTN. She  complains of anxiety.  She has the following symptoms: difficulty concentrating, palpitations, racing thoughts, shortness of breath, sweating. Onset of symptoms was approximately a few months ago, gradually worsening since that time. She denies current suicidal and homicidal ideation. Family history significant for psychiatric illness. Se reports her father is bipolar/ schizophrenic and mother has anxiety /PTSD. Possible organic causes contributing are: endocrine/metabolic. Risk factors: family history Previous treatment includes none.She is agreeable to speaking with LCSW today, declines medication or referral at this time. Dark colored urine: She reports history of left sided kidney stone in the past. Urine dark yellow in appearance. She denies any hematuria, dysuria,  back pain, N/V,  or fevers.     Outpatient Medications Prior to Visit  Medication Sig Dispense Refill  . hydrochlorothiazide (HYDRODIURIL) 25 MG tablet Take 1 tablet (25 mg total) by mouth daily. 90 tablet 1  . lisinopril (PRINIVIL,ZESTRIL) 10 MG tablet Take 1 tablet (10 mg total) by mouth daily. 90 tablet 0  . ranitidine (ZANTAC) 150 MG capsule Take 1 capsule (150 mg total) by mouth daily. 30 capsule 3  .  HYDROcodone-acetaminophen (NORCO/VICODIN) 5-325 MG tablet Take 2 tablets by mouth every 4 (four) hours as needed. 6 tablet 0  . methocarbamol (ROBAXIN) 500 MG tablet Take 1 tablet (500 mg total) by mouth 3 (three) times daily. Take only if you continue to have muscle stiffness and soreness (Patient taking differently: Take 500 mg by mouth every 8 (eight) hours as needed for muscle spasms. Take only if you continue to have muscle stiffness and soreness) 90 tablet 0  . ondansetron (ZOFRAN) 4 MG tablet Take 1 tablet (4 mg total) by mouth every 6 (six) hours. 12 tablet 0  . oxyCODONE-acetaminophen (PERCOCET/ROXICET) 5-325 MG tablet Take 1 tablet by mouth every 6 (six) hours as needed for severe pain. 6 tablet 0  . tamsulosin (FLOMAX) 0.4 MG CAPS capsule Take 1 capsule (0.4 mg total) by mouth daily. 10 capsule 0  . potassium chloride SA (K-DUR,KLOR-CON) 20 MEQ tablet Take 1 tablet (20 mEq total) by mouth 2 (two) times daily. 4 tablet 0   No facility-administered medications prior to visit.     ROS Review of Systems  Constitutional: Negative.   Eyes: Negative.   Respiratory: Negative.   Cardiovascular: Positive for palpitations.  Gastrointestinal: Negative.   Endocrine: Negative.   Skin: Negative.   Neurological: Negative for dizziness, syncope and headaches.  Psychiatric/Behavioral: Negative for suicidal ideas. The patient is nervous/anxious.     Objective:  BP 134/82 (BP Location: Left Arm, Patient Position: Sitting, Cuff Size: Normal)   Pulse (!) 103   Temp 98.6 F (37 C) (Oral)   Resp 18   Ht 5\' 7"  (1.702 m)   Wt 176 lb 9.6 oz (80.1 kg)   LMP 12/04/2016   SpO2  99%   BMI 27.66 kg/m   BP/Weight 01/01/2017 12/18/2016 12/17/2016  Systolic BP 134 148 134  Diastolic BP 82 85 91  Wt. (Lbs) 176.6 182 -  BMI 27.66 28.51 -     Physical Exam  Constitutional: She is oriented to person, place, and time. She appears well-developed and well-nourished.  Eyes: Conjunctivae are normal.  Pupils are equal, round, and reactive to light.  Neck: Normal range of motion. Neck supple. No thyromegaly present.  Cardiovascular: Normal rate, regular rhythm, normal heart sounds and intact distal pulses.  Pulmonary/Chest: Effort normal and breath sounds normal.  Abdominal: Soft. Bowel sounds are normal. There is no tenderness.  Neurological: She is alert and oriented to person, place, and time.  Skin: Skin is warm and dry.  Psychiatric: Her mood appears anxious. Her speech is rapid and/or pressured. She expresses no homicidal and no suicidal ideation. She expresses no suicidal plans and no homicidal plans.  Nursing note and vitals reviewed.    Assessment & Plan:   1. Essential hypertension  - hydrochlorothiazide (HYDRODIURIL) 25 MG tablet; Take 1 tablet (25 mg total) by mouth daily.  Dispense: 90 tablet; Refill: 1 - lisinopril (PRINIVIL,ZESTRIL) 10 MG tablet; Take 1 tablet (10 mg total) by mouth daily.  Dispense: 90 tablet; Refill: 1  2. Anxiety  LCSW spoke with patient and provided resources. - hydrOXYzine (ATARAX/VISTARIL) 10 MG tablet; Take 1 tablet (10 mg total) by mouth every 8 (eight) hours as needed for anxiety (insomnia).  Dispense: 30 tablet; Refill: 0  3. Palpitations  -EKG - TSH - CBC - Basic Metabolic Panel - ECHOCARDIOGRAM COMPLETE; Future  4. Gastroesophageal reflux disease without esophagitis  - ranitidine (ZANTAC) 150 MG capsule; Take 1 capsule (150 mg total) by mouth 2 (two) times daily.  Dispense: 60 capsule; Refill: 5  5. Abnormal ECG  - ECHOCARDIOGRAM COMPLETE; Future  6. Dark urine  - POCT urinalysis dipstick    Follow-up: Return in about 3 months (around 04/03/2017) for HTN.   Lizbeth BarkMandesia R Hairston FNP

## 2017-01-01 NOTE — BH Specialist Note (Signed)
Integrated Behavioral Health Initial Visit  MRN: [id number] Name: Denise Murphy  Number of Integrated Behavioral Health Clinician visits:: 1/6 Session Start time: 4:45 PM  Session End time: 5:15 PM Total time: 30 minutes  Type of Service: Integrated Behavioral Health- Individual/Family Interpretor:No. Interpretor Name and Language: N/A   Warm Hand Off Completed.       SUBJECTIVE: Denise Murphy is a 36 y.o. female accompanied by self Patient was referred by FNP Hairston for anxiety. Patient reports the following symptoms/concerns: overwhelming feelings of worry, trembling, sweating, panic attacks, decreased appetite, and difficulty sleeping Duration of problem: 1-2 months; Severity of problem: moderate  OBJECTIVE: Mood: Anxious and Affect: Appropriate Risk of harm to self or others: No plan to harm self or others  LIFE CONTEXT: Family and Social: Pt is a mother to two minor daughters. She receives limited support due to family (mother, father, sister) residing in [place], [place] and sister residing in [place]. Pt reports hx of mental illness in family School/Work: Pt is employed full-time, working 55-60 hours weekly. Pt reported that her food stamps may be ending soon and she doesn't receive any additional financial assistance Self-Care: Pt does not use substances. She listens to music to cope with stressors Life Changes: Pt recently ended relationship with boyfriend of 6 years. She is now solely responsible for household bills  GOALS ADDRESSED: Patient will: 1. Reduce symptoms of: anxiety and stress 2. Increase knowledge and/or ability of: coping skills  3. Demonstrate ability to: Increase adequate support systems for patient/family  INTERVENTIONS: Interventions utilized: Mindfulness or Management consultantelaxation Training, Supportive Counseling, Psychoeducation and/or Health Education and Link to [facility]  Standardized Assessments completed: Staff did not provide screenings to  pt  ASSESSMENT: Patient currently experiencing anxiety triggered by recent break-up with boyfriend of six years and financial strain. She reports diagnosis of PTSD and Anxiety approximately eight years ago; however, current symptoms began two months ago. Pt receives limited support.   Patient may benefit from psychoeducation, psychotherapy, and medication management. LCSWA educated pt on correlation between physical and mental health, in addition, to how stress can negatively impact both. LCSWA introduced relaxation and mindfulness interventions to decrease symptoms. Pt stated that her schedule will not permit her to initiate psychotherapy; however, she agreed to participate in medication management to improve sleep. Resources for crisis intervention and psychotherapy were provided for future reference.  PLAN: 1. Follow up with behavioral health clinician on : Pt was encouraged to contact LCSWA if symptoms worsen or fail to improve to schedule [facility]. 2. Behavioral recommendations: LCSWA recommends that pt apply healthy coping skills discussed, comply with medication management, and utilize provided resources. Pt is encouraged to schedule follow up appointment with LCSWA 3. Referral(s): Integrated Art [facility] managerBehavioral Health Services (In Clinic) and Community Mental Health Services (LME/Outside Clinic) 4. "From scale of 1-10, how likely are you to follow plan?": 7/10  Bridgett LarssonJasmine D Lewis, LCSW 01/04/17 2:37 PM

## 2017-01-01 NOTE — Progress Notes (Signed)
Patient is here for f/up  

## 2017-01-02 LAB — CBC
HEMATOCRIT: 39.8 % (ref 34.0–46.6)
Hemoglobin: 13.8 g/dL (ref 11.1–15.9)
MCH: 27.9 pg (ref 26.6–33.0)
MCHC: 34.7 g/dL (ref 31.5–35.7)
MCV: 80 fL (ref 79–97)
PLATELETS: 248 10*3/uL (ref 150–379)
RBC: 4.95 x10E6/uL (ref 3.77–5.28)
RDW: 15.5 % — AB (ref 12.3–15.4)
WBC: 9.7 10*3/uL (ref 3.4–10.8)

## 2017-01-02 LAB — BASIC METABOLIC PANEL
BUN/Creatinine Ratio: 20 (ref 9–23)
BUN: 16 mg/dL (ref 6–20)
CALCIUM: 10.1 mg/dL (ref 8.7–10.2)
CHLORIDE: 99 mmol/L (ref 96–106)
CO2: 22 mmol/L (ref 20–29)
Creatinine, Ser: 0.8 mg/dL (ref 0.57–1.00)
GFR calc Af Amer: 112 mL/min/{1.73_m2} (ref >59)
GFR, EST NON AFRICAN AMERICAN: 97 mL/min/{1.73_m2} (ref >59)
GLUCOSE: 93 mg/dL (ref 65–99)
POTASSIUM: 3.7 mmol/L (ref 3.5–5.2)
SODIUM: 140 mmol/L (ref 134–144)

## 2017-01-02 LAB — TSH: TSH: 1.31 u[IU]/mL (ref 0.450–4.500)

## 2017-01-12 MED FILL — hydrOXYzine HCL 10 MG TABS: 10 | 10 days supply | Qty: 30 | Fill #0

## 2017-01-13 ENCOUNTER — Ambulatory Visit (HOSPITAL_COMMUNITY): Payer: Self-pay

## 2017-01-18 ENCOUNTER — Ambulatory Visit (HOSPITAL_COMMUNITY)
Admission: RE | Admit: 2017-01-18 | Discharge: 2017-01-18 | Disposition: A | Discharge status: Home / Self Care | Payer: Self-pay | Source: Ambulatory Visit | Attending: Family Medicine | Admitting: Family Medicine

## 2017-01-18 DIAGNOSIS — R9431 Abnormal electrocardiogram [ECG] [EKG]: Secondary | ICD-10-CM | POA: Insufficient documentation

## 2017-01-18 DIAGNOSIS — R002 Palpitations: Secondary | ICD-10-CM | POA: Insufficient documentation

## 2017-01-18 NOTE — Progress Notes (Signed)
  Echocardiogram 2D Echocardiogram has been performed.  Patient stated "I am having a panic attack" before exam started.   Denise Murphy L Androw 01/18/2017, 3:44 PM

## 2017-01-26 ENCOUNTER — Telehealth: Payer: Self-pay

## 2017-01-26 NOTE — Telephone Encounter (Signed)
CMA call regarding lab results   Patient Verify DOB   Patient was aware and understood  

## 2017-01-26 NOTE — Telephone Encounter (Signed)
-----   Message from Lizbeth BarkMandesia R Hairston, [place] sent at 01/26/2017  9:12 AM EST ----- Thyroid function normal Labs that evaluated your blood cells, fluid and electrolyte balance are normal. No signs of anemia, acute infection, or inflammation present.

## 2017-02-01 ENCOUNTER — Telehealth: Payer: Self-pay

## 2017-02-01 NOTE — Telephone Encounter (Signed)
-----   Message from Lizbeth BarkMandesia R Hairston, [place] sent at 01/29/2017  1:00 PM EST ----- -Echocardiogram which looks at your heart structure and function was normal. No abnormalities of the heart valves or chambers.

## 2017-02-01 NOTE — Telephone Encounter (Signed)
CMA call regarding echo results   Patient Verify DOB   Patient was aware and understood

## 2017-03-11 MED FILL — LISINOPRIL 10 MG TABS: 10 | 30 days supply | Qty: 30 | Fill #1

## 2017-03-11 MED FILL — raNITIdine HCL 150 MG TABS: 150 | 30 days supply | Qty: 60 | Fill #1 | Status: TO

## 2017-03-11 MED FILL — HYDROCHLOROTHIAZIDE 25 MG T: 25 | 30 days supply | Qty: 30 | Fill #1

## 2017-04-05 ENCOUNTER — Ambulatory Visit: Payer: Self-pay | Admitting: Family Medicine

## 2017-04-15 MED FILL — HYDROCHLOROTHIAZIDE 25 MG T: 25 | 30 days supply | Qty: 30 | Fill #2 | Status: TO

## 2017-04-15 MED FILL — LISINOPRIL 10 MG TABS: 10 | 30 days supply | Qty: 30 | Fill #2 | Status: TO

## 2017-05-05 ENCOUNTER — Encounter: Payer: Self-pay | Admitting: Nurse Practitioner

## 2017-05-05 ENCOUNTER — Ambulatory Visit: Payer: Self-pay | Attending: Nurse Practitioner | Admitting: Nurse Practitioner

## 2017-05-05 VITALS — BP 146/101 | HR 110 | Temp 98.5°F | Ht 67.0 in | Wt 178.2 lb

## 2017-05-05 DIAGNOSIS — I10 Essential (primary) hypertension: Secondary | ICD-10-CM

## 2017-05-05 DIAGNOSIS — Z9851 Tubal ligation status: Secondary | ICD-10-CM | POA: Insufficient documentation

## 2017-05-05 DIAGNOSIS — Z79891 Long term (current) use of opiate analgesic: Secondary | ICD-10-CM | POA: Insufficient documentation

## 2017-05-05 DIAGNOSIS — G4700 Insomnia, unspecified: Secondary | ICD-10-CM | POA: Insufficient documentation

## 2017-05-05 DIAGNOSIS — F419 Anxiety disorder, unspecified: Secondary | ICD-10-CM

## 2017-05-05 DIAGNOSIS — Z79899 Other long term (current) drug therapy: Secondary | ICD-10-CM | POA: Insufficient documentation

## 2017-05-05 DIAGNOSIS — Z818 Family history of other mental and behavioral disorders: Secondary | ICD-10-CM | POA: Insufficient documentation

## 2017-05-05 DIAGNOSIS — R000 Tachycardia, unspecified: Secondary | ICD-10-CM | POA: Insufficient documentation

## 2017-05-05 DIAGNOSIS — R002 Palpitations: Secondary | ICD-10-CM | POA: Insufficient documentation

## 2017-05-05 DIAGNOSIS — R0602 Shortness of breath: Secondary | ICD-10-CM | POA: Insufficient documentation

## 2017-05-05 DIAGNOSIS — Z8249 Family history of ischemic heart disease and other diseases of the circulatory system: Secondary | ICD-10-CM | POA: Insufficient documentation

## 2017-05-05 MED ORDER — LISINOPRIL 10 MG PO TABS: 10.0000 mg | ORAL_TABLET | Freq: Every day | ORAL | 1 refills | Status: DC

## 2017-05-05 MED ORDER — HYDROCHLOROTHIAZIDE 25 MG PO TABS: 25.0000 mg | ORAL_TABLET | Freq: Every day | ORAL | 1 refills | Status: AC

## 2017-05-05 MED ORDER — PROPRANOLOL HCL ER 60 MG PO CP24: 60.0000 mg | ORAL_CAPSULE | Freq: Every day | ORAL | 0 refills | Status: AC

## 2017-05-05 MED FILL — LISINOPRIL 10 MG TABS: 10 | 30 days supply | Qty: 30 | Fill #0 | Status: TO

## 2017-05-05 MED FILL — HYDROCHLOROTHIAZIDE 25 MG T: 25 | 30 days supply | Qty: 30 | Fill #0 | Status: TO

## 2017-05-05 NOTE — Patient Instructions (Addendum)
Generalized Anxiety Disorder, Adult Generalized anxiety disorder (GAD) is a mental health disorder. People with this condition constantly worry about everyday events. Unlike normal anxiety, worry related to GAD is not triggered by a specific event. These worries also do not fade or get better with time. GAD interferes with life functions, including relationships, work, and school. GAD can vary from mild to severe. People with severe GAD can have intense waves of anxiety with physical symptoms (panic attacks). What are the causes? The exact cause of GAD is not known. What increases the risk? This condition is more likely to develop in:  Women.  People who have a family history of anxiety disorders.  People who are very shy.  People who experience very stressful life events, such as the death of a loved one.  People who have a very stressful family environment.  What are the signs or symptoms? People with GAD often worry excessively about many things in their lives, such as their health and family. They may also be overly concerned about:  Doing well at work.  Being on time.  Natural disasters.  Friendships.  Physical symptoms of GAD include:  Fatigue.  Muscle tension or having muscle twitches.  Trembling or feeling shaky.  Being easily startled.  Feeling like your heart is pounding or racing.  Feeling out of breath or like you cannot take a deep breath.  Having trouble falling asleep or staying asleep.  Sweating.  Nausea, diarrhea, or irritable bowel syndrome (IBS).  Headaches.  Trouble concentrating or remembering facts.  Restlessness.  Irritability.  How is this diagnosed? Your health care provider can diagnose GAD based on your symptoms and medical history. You will also have a physical exam. The health care provider will ask specific questions about your symptoms, including how severe they are, when they started, and if they come and go. Your health care  provider may ask you about your use of alcohol or drugs, including prescription medicines. Your health care provider may refer you to a mental health specialist for further evaluation. Your health care provider will do a thorough examination and may perform additional tests to rule out other possible causes of your symptoms. To be diagnosed with GAD, a person must have anxiety that:  Is out of his or her control.  Affects several different aspects of his or her life, such as work and relationships.  Causes distress that makes him or her unable to take part in normal activities.  Includes at least three physical symptoms of GAD, such as restlessness, fatigue, trouble concentrating, irritability, muscle tension, or sleep problems.  Before your health care provider can confirm a diagnosis of GAD, these symptoms must be present more days than they are not, and they must last for six months or longer. How is this treated? The following therapies are usually used to treat GAD:  Medicine. Antidepressant medicine is usually prescribed for long-term daily control. Antianxiety medicines may be added in severe cases, especially when panic attacks occur.  Talk therapy (psychotherapy). Certain types of talk therapy can be helpful in treating GAD by providing support, education, and guidance. Options include: ? Cognitive behavioral therapy (CBT). People learn coping skills and techniques to ease their anxiety. They learn to identify unrealistic or negative thoughts and behaviors and to replace them with positive ones. ? Acceptance and commitment therapy (ACT). This treatment teaches people how to be mindful as a way to cope with unwanted thoughts and feelings. ? Biofeedback. This process trains you to   you to manage your body's response (physiological response) through breathing techniques and relaxation methods. You will work with a therapist while machines are used to monitor your physical symptoms.  Stress  management techniques. These include yoga, meditation, and exercise.  A mental health specialist can help determine which treatment is best for you. Some people see improvement with one type of therapy. However, other people require a combination of therapies. Follow these instructions at home:  Take over-the-counter and prescription medicines only as told by your health care provider.  Try to maintain a normal routine.  Try to anticipate stressful situations and allow extra time to manage them.  Practice any stress management or self-calming techniques as taught by your health care provider.  Do not punish yourself for setbacks or for not making progress.  Try to recognize your accomplishments, even if they are small.  Keep all follow-up visits as told by your health care provider. This is important. Contact a health care provider if:  Your symptoms do not get better.  Your symptoms get worse.  You have signs of depression, such as: ? A persistently sad, cranky, or irritable mood. ? Loss of enjoyment in activities that used to bring you joy. ? Change in weight or eating. ? Changes in sleeping habits. ? Avoiding friends or family members. ? Loss of energy for normal tasks. ? Feelings of guilt or worthlessness. Get help right away if:  You have serious thoughts about hurting yourself or others. If you ever feel like you may hurt yourself or others, or have thoughts about taking your own life, get help right away. You can go to your nearest emergency department or call:  Your local emergency services (911 in the [place]  A suicide crisis helpline, such as the National Suicide Prevention Lifeline at 1-800-273-8255. This is open 24 hours a day.  Summary  Generalized anxiety disorder (GAD) is a mental health disorder that involves worry that is not triggered by a specific event.  People with GAD often worry excessively about many things in their lives, such as their health and  family.  GAD may cause physical symptoms such as restlessness, trouble concentrating, sleep problems, frequent sweating, nausea, diarrhea, headaches, and trembling or muscle twitching.  A mental health specialist can help determine which treatment is best for you. Some people see improvement with one type of therapy. However, other people require a combination of therapies. This information is not intended to replace advice given to you by your health care provider. Make sure you discuss any questions you have with your health care provider. Document Released: 06/13/2012 Document Revised: 01/07/2016 Document Reviewed: 01/07/2016 Elsevier Interactive Patient Education  2018 [employer]      Living With Anxiety  After being diagnosed with an anxiety disorder, you may be relieved to know why you have felt or behaved a certain way. It is natural to also feel overwhelmed about the treatment ahead and what it will mean for your life. With care and support, you can manage this condition and recover from it. How to cope with anxiety Dealing with stress Stress is your body's reaction to life changes and events, both good and bad. Stress can last just a few hours or it can be ongoing. Stress can play a major role in anxiety, so it is important to learn both how to cope with stress and how to think about it differently. Talk with your health care provider or a counselor to learn more about stress reduction. He   or she may suggest some stress reduction techniques, such as:  Music therapy. This can include creating or listening to music that you enjoy and that inspires you.  Mindfulness-based meditation. This involves being aware of your normal breaths, rather than trying to control your breathing. It can be done while sitting or walking.  Centering prayer. This is a kind of meditation that involves focusing on a word, phrase, or sacred image that is meaningful to you and that brings you peace.  Deep  breathing. To do this, expand your stomach and inhale slowly through your nose. Hold your breath for 3-5 seconds. Then exhale slowly, allowing your stomach muscles to relax.  Self-talk. This is a skill where you identify thought patterns that lead to anxiety reactions and correct those thoughts.  Muscle relaxation. This involves tensing muscles then relaxing them.  Choose a stress reduction technique that fits your lifestyle and personality. Stress reduction techniques take time and practice. Set aside 5-15 minutes a day to do them. Therapists can offer training in these techniques. The training may be covered by some insurance plans. Other things you can do to manage stress include:  Keeping a stress diary. This can help you learn what triggers your stress and ways to control your response.  Thinking about how you respond to certain situations. You may not be able to control everything, but you can control your reaction.  Making time for activities that help you relax, and not feeling guilty about spending your time in this way.  Therapy combined with coping and stress-reduction skills provides the best chance for successful treatment. Medicines Medicines can help ease symptoms. Medicines for anxiety include:  Anti-anxiety drugs.  Antidepressants.  Beta-blockers.  Medicines may be used as the main treatment for anxiety disorder, along with therapy, or if other treatments are not working. Medicines should be prescribed by a health care provider. Relationships Relationships can play a big part in helping you recover. Try to spend more time connecting with trusted friends and family members. Consider going to couples counseling, taking family education classes, or going to family therapy. Therapy can help you and others better understand the condition. How to recognize changes in your condition Everyone has a different response to treatment for anxiety. Recovery from anxiety happens when  symptoms decrease and stop interfering with your daily activities at home or work. This may mean that you will start to:  Have better concentration and focus.  Sleep better.  Be less irritable.  Have more energy.  Have improved memory.  It is important to recognize when your condition is getting worse. Contact your health care provider if your symptoms interfere with home or work and you do not feel like your condition is improving. Where to find help and support: You can get help and support from these sources:  Self-help groups.  Online and community organizations.  A trusted spiritual leader.  Couples counseling.  Family education classes.  Family therapy.  Follow these instructions at home:  Eat a healthy diet that includes plenty of vegetables, fruits, whole grains, low-fat dairy products, and lean protein. Do not eat a lot of foods that are high in solid fats, added sugars, or salt.  Exercise. Most adults should do the following: ? Exercise for at least 150 minutes each week. The exercise should increase your heart rate and make you sweat (moderate-intensity exercise). ? Strengthening exercises at least twice a week.  Cut down on caffeine, tobacco, alcohol, and other potentially harmful substances.    sleep. Most adults need 7-9 hours of sleep each night.  Make choices that simplify your life.  Take over-the-counter and prescription medicines only as told by your health care provider.  Avoid caffeine, alcohol, and certain over-the-counter cold medicines. These may make you feel worse. Ask your pharmacist which medicines to avoid.  Keep all follow-up visits as told by your health care provider. This is important. Questions to ask your health care provider  Would I benefit from therapy?  How often should I follow up with a health care provider?  How long do I need to take medicine?  Are there any long-term side effects of my  medicine?  Are there any alternatives to taking medicine? Contact a health care provider if:  You have a hard time staying focused or finishing daily tasks.  You spend many hours a day feeling worried about everyday life.  You become exhausted by worry.  You start to have headaches, feel tense, or have nausea.  You urinate more than normal.  You have diarrhea. Get help right away if:  You have a racing heart and shortness of breath.  You have thoughts of hurting yourself or others. If you ever feel like you may hurt yourself or others, or have thoughts about taking your own life, get help right away. You can go to your nearest emergency department or call:  Your local emergency services (911 in the [place]  A suicide crisis helpline, such as the National Suicide Prevention Lifeline at 308-517-1425. This is open 24-hours a day.  Summary  Taking steps to deal with stress can help calm you.  Medicines cannot cure anxiety disorders, but they can help ease symptoms.  Family, friends, and partners can play a big part in helping you recover from an anxiety disorder. This information is not intended to replace advice given to you by your health care provider. Make sure you discuss any questions you have with your health care provider. Document Released: 02/11/2016 Document Revised: 02/11/2016 Document Reviewed: 02/11/2016 Elsevier Interactive Patient Education  2018 [employer]. Panic Attack A panic attack is a sudden episode of severe anxiety, fear, or discomfort that causes physical and emotional symptoms. The attack may be in response to something frightening, or it may occur for no known reason. Symptoms of a panic attack can be similar to symptoms of a heart attack or stroke. It is important to see your health care provider when you have a panic attack so that these conditions can be ruled out. A panic attack is a symptom of another condition. Most panic attacks go away with  treatment of the underlying problem. If you have panic attacks often, you may have a condition called panic disorder. What are the causes? A panic attack may be caused by:  An extreme, life-threatening situation, such as a war or natural disaster.  An anxiety disorder, such as post-traumatic stress disorder.  Depression.  Certain medical conditions, including heart problems, neurological conditions, and infections.  Certain over-the-counter and prescription medicines.  Illegal drugs that increase heart rate and blood pressure, such as methamphetamine.  Alcohol.  Supplements that increase anxiety.  Panic disorder.  What increases the risk? You are more likely to develop this condition if:  You have an anxiety disorder.  You have another mental health condition.  You take certain medicines.  You use alcohol, illegal drugs, or other substances.  You are under extreme stress.  A life event is causing increased feelings of anxiety and depression.  What  are the signs or symptoms? A panic attack starts suddenly, usually lasts about 20 minutes, and occurs with one or more of the following:  A pounding heart.  A feeling that your heart is beating irregularly or faster than normal (palpitations).  Sweating.  Trembling or shaking.  Shortness of breath or feeling smothered.  Feeling choked.  Chest pain or discomfort.  Nausea or a strange feeling in your stomach.  Dizziness, feeling lightheaded, or feeling like you might faint.  Chills or hot flashes.  Numbness or tingling in your lips, hands, or feet.  Feeling confused, or feeling that you are not yourself.  Fear of losing control or being emotionally unstable.  Fear of dying.  How is this diagnosed? A panic attack is diagnosed with an assessment by your health care provider. During the assessment your health care provider will ask questions about:  Your history of anxiety, depression, and panic  attacks.  Your medical history.  Whether you drink alcohol, use illegal drugs, take supplements, or take medicines. Be honest about your substance use.  Your health care provider may also:  Order blood tests or other kinds of tests to rule out serious medical conditions.  Refer you to a mental health professional for further evaluation.  How is this treated? Treatment depends on the cause of the panic attack:  If the cause is a medical problem, your health care provider will either treat that problem or refer you to a specialist.  If the cause is emotional, you may be given anti-anxiety medicines or referred to a counselor. These medicines may reduce how often attacks happen, reduce how severe the attacks are, and lower anxiety.  If the cause is a medicine, your health care provider may tell you to stop the medicine, change your dose, or take a different medicine.  If the cause is a drug, treatment may involve letting the drug wear off and taking medicine to help the drug leave your body or to counteract its effects. Attacks caused by drug abuse may continue even if you stop using the drug.  Follow these instructions at home:  Take over-the-counter and prescription medicines only as told by your health care provider.  If you feel anxious, limit your caffeine intake.  Take good care of your physical and mental health by: ? Eating a balanced diet that includes plenty of fresh fruits and vegetables, whole grains, lean meats, and low-fat dairy. ? Getting plenty of rest. Try to get 7-8 hours of uninterrupted sleep each night. ? Exercising regularly. Try to get 30 minutes of physical activity at least 5 days a week. ? Not smoking. Talk to your health care provider if you need help quitting. ? Limiting alcohol intake to no more than 1 drink a day for nonpregnant women and 2 drinks a day for men. One drink equals 12 oz of beer, 5 oz of wine, or 1 oz of hard liquor.  Keep all follow-up  visits as told by your health care provider. This is important. Panic attacks may have underlying physical or emotional problems that take time to accurately diagnose. Contact a health care provider if:  Your symptoms do not improve, or they get worse.  You are not able to take your medicine as prescribed because of side effects. Get help right away if:  You have serious thoughts about hurting yourself or others.  You have symptoms of a panic attack. Do not drive yourself to the hospital. Have someone else drive you or call  an ambulance. If you ever feel like you may hurt yourself or others, or you have thoughts about taking your own life, get help right away. You can go to your nearest emergency department or call:  Your local emergency services (911 in the [place]  A suicide crisis helpline, such as the National Suicide Prevention Lifeline at 660-012-8819. This is open 24 hours a day.  Summary  A panic attack is a sign of a serious health or mental health condition. Get help right away. Do not drive yourself to the hospital. Have someone else drive you or call an ambulance.  Always see a health care provider to have the reasons for the panic attack correctly diagnosed.  If your panic attack was caused by a physical problem, follow your health care provider's suggestions for medicine, referral to a specialist, and lifestyle changes.  If your panic attack was caused by an emotional problem, follow through with counseling from a qualified mental health specialist.  If you feel like you may hurt yourself or others, call 911 and get help right away. This information is not intended to replace advice given to you by your health care provider. Make sure you discuss any questions you have with your health care provider. Document Released: 02/16/2005 Document Revised: 03/27/2016 Document Reviewed: 03/27/2016 Elsevier Interactive Patient Education  2018 [employer] Propranolol  extended-release capsules What is this medicine? PROPRANOLOL (proe PRAN oh lole) is a beta-blocker. Beta-blockers reduce the workload on the heart and help it to beat more regularly. This medicine is used to treat high blood pressure, heart muscle disease, and prevent chest pain caused by angina. It is also used to prevent migraine headaches. You should not use this medicine to treat a migraine that has already started. This medicine may be used for other purposes; ask your health care provider or pharmacist if you have questions. COMMON BRAND NAME(S): Inderal LA, Inderal XL, InnoPran XL What should I tell my health care provider before I take this medicine? They need to know if you have any of these conditions: -circulation problems, or blood vessel disease -diabetes -history of heart attack or heart disease, vasospastic angina -kidney disease -liver disease -lung or breathing disease, like asthma or emphysema -pheochromocytoma -slow heart rate -thyroid disease -an unusual or allergic reaction to propranolol, other beta-blockers, medicines, foods, dyes, or preservatives -pregnant or trying to get pregnant -breast-feeding How should I use this medicine? Take this medicine by mouth with a glass of water. Follow the directions on the prescription label. Do not crush or chew. Take your doses at regular intervals. Do not take your medicine more often than directed. Do not stop taking except on the advice of your doctor or health care professional. Talk to your pediatrician regarding the use of this medicine in children. Special care may be needed. Overdosage: If you think you have taken too much of this medicine contact a poison control center or emergency room at once. NOTE: This medicine is only for you. Do not share this medicine with others. What if I miss a dose? If you miss a dose, take it as soon as you can. If it is almost time for your next dose, take only that dose. Do not take double  or extra doses. What may interact with this medicine? Do not take this medicine with any of the following medications: -feverfew -phenothiazines like chlorpromazine, mesoridazine, prochlorperazine, thioridazine This medicine may also interact with the following medications: -aluminum hydroxide gel -antipyrine -antiviral medicines for HIV  or AIDS -barbiturates like phenobarbital -certain medicines for blood pressure, heart disease, irregular heart beat -cimetidine -ciprofloxacin -diazepam -fluconazole -haloperidol -isoniazid -medicines for cholesterol like cholestyramine or colestipol -medicines for mental depression -medicines for migraine headache like almotriptan, eletriptan, frovatriptan, naratriptan, rizatriptan, sumatriptan, zolmitriptan -NSAIDs, medicines for pain and inflammation, like ibuprofen or naproxen -phenytoin -rifampin -teniposide -theophylline -thyroid medicines -tolbutamide -warfarin -zileuton This list may not describe all possible interactions. Give your health care provider a list of all the medicines, herbs, non-prescription drugs, or dietary supplements you use. Also tell them if you smoke, drink alcohol, or use illegal drugs. Some items may interact with your medicine. What should I watch for while using this medicine? Visit your doctor or health care professional for regular check ups. Contact your doctor right away if your symptoms worsen. Check your blood pressure and pulse rate regularly. Ask your health care professional what your blood pressure and pulse rate should be, and when you should contact them. Do not stop taking this medicine suddenly. This could lead to serious heart-related effects. You may get drowsy or dizzy. Do not drive, use machinery, or do anything that needs mental alertness until you know how this drug affects you. Do not stand or sit up quickly, especially if you are an older patient. This reduces the risk of dizzy or fainting  spells. Alcohol can make you more drowsy and dizzy. Avoid alcoholic drinks. This medicine can affect blood sugar levels. If you have diabetes, check with your doctor or health care professional before you change your diet or the dose of your diabetic medicine. Do not treat yourself for coughs, colds, or pain while you are taking this medicine without asking your doctor or health care professional for advice. Some ingredients may increase your blood pressure. What side effects may I notice from receiving this medicine? Side effects that you should report to your doctor or health care professional as soon as possible: -allergic reactions like skin rash, itching or hives, swelling of the face, lips, or tongue -breathing problems -changes in blood sugar -cold hands or feet -difficulty sleeping, nightmares -dry peeling skin -hallucinations -muscle cramps or weakness -slow heart rate -swelling of the legs and ankles -vomiting Side effects that usually do not require medical attention (report to your doctor or health care professional if they continue or are bothersome): -change in sex drive or performance -diarrhea -dry sore eyes -hair loss -nausea -weak or tired This list may not describe all possible side effects. Call your doctor for medical advice about side effects. You may report side effects to FDA at 1-800-FDA-1088. Where should I keep my medicine? Keep out of the reach of children. Store at room temperature between 15 and 30 degrees C (59 and 86 degrees F). Protect from light, moisture and freezing. Keep container tightly closed. Throw away any unused medicine after the expiration date. NOTE: This sheet is a summary. It may not cover all possible information. If you have questions about this medicine, talk to your doctor, pharmacist, or health care provider.  2018 Elsevier/Gold Standard (2012-10-21 14:58:56)

## 2017-05-05 NOTE — Progress Notes (Signed)
Assessment & Plan:  Denise Murphy was seen today for establish care.  Diagnoses and all orders for this visit:  Tachycardia with hypertension -     propranolol ER (INDERAL LA) 60 MG 24 hr capsule; Take 1 capsule (60 mg total) by mouth daily.  Anxiety -     propranolol ER (INDERAL LA) 60 MG 24 hr capsule; Take 1 capsule (60 mg total) by mouth daily. Avoid anxiety triggers if possible.   Essential hypertension -     lisinopril (PRINIVIL,ZESTRIL) 10 MG tablet; Take 1 tablet (10 mg total) by mouth daily. -     hydrochlorothiazide (HYDRODIURIL) 25 MG tablet; Take 1 tablet (25 mg total) by mouth daily. Continue all antihypertensives as prescribed.  Remember to bring in your blood pressure log with you for your follow up appointment.  DASH/Mediterranean Diets are healthier choices for HTN.    Patient has been counseled on age-appropriate routine health concerns for screening and prevention. These are reviewed and up-to-date. Referrals have been placed accordingly. Immunizations are up-to-date or declined.    Subjective:   Chief Complaint  Patient presents with  . Establish Care    Patient is here to establish care for hypertension.    HPI Denise Murphy 37 y.o. female presents to office today to establish care. She has a history of HTN and anxiety.   Essential Hypertension Checking her blood pressure at home with average 130-140/90s. She is currently taking HCTZ 25mg  daily and lisinopril 10mg  daily. Her blood pressure is not well controlled despite medication compliance. She is also tachycardic. She does endorse a strong family history of HTN as well as a personal history of anxiety. Denies chest pain, shortness of breath, lightheadedness, dizziness, headaches or BLE edema.  BP Readings from Last 3 Encounters:  05/05/17 (!) 146/101  01/01/17 134/82  12/18/16 (!) 148/85   Anxiety Patient complains of anxiety disorder and panic attacks.  She has the following symptoms: insomnia,  palpitations, shortness of breath, sweating. Onset of symptoms was approximately several months ago, unchanged since that time. She denies current suicidal and homicidal ideation. Family history significant for anxiety and depression.Possible organic causes contributing are: none. Risk factors: positive family history in  father Previous treatment includes none .  Depression screen PHQ 2/9 05/05/2017  Decreased Interest 0  Down, Depressed, Hopeless 1  PHQ - 2 Score 1  Altered sleeping 1  Tired, decreased energy 1  Change in appetite 0  Feeling bad or failure about yourself  0  Trouble concentrating 0  Moving slowly or fidgety/restless 0  Suicidal thoughts 0  PHQ-9 Score 3   Review of Systems  Constitutional: Negative for fever, malaise/fatigue and weight loss.  HENT: Negative.  Negative for nosebleeds.   Eyes: Negative.  Negative for blurred vision, double vision and photophobia.  Respiratory: Negative.  Negative for cough and shortness of breath.   Cardiovascular: Negative.  Negative for chest pain, palpitations and leg swelling.  Gastrointestinal: Negative.  Negative for heartburn, nausea and vomiting.  Musculoskeletal: Negative.  Negative for myalgias.  Neurological: Negative.  Negative for dizziness, focal weakness, seizures and headaches.  Psychiatric/Behavioral: Negative for depression, hallucinations, memory loss, substance abuse and suicidal ideas. The patient is nervous/anxious and has insomnia.     Past Medical History:  Diagnosis Date  . Hypertension     Past Surgical History:  Procedure Laterality Date  . TUBAL LIGATION      History reviewed. No pertinent family history.  Social History Reviewed with no changes to  be made today.   Outpatient Medications Prior to Visit  Medication Sig Dispense Refill  . ranitidine (ZANTAC) 150 MG capsule Take 1 capsule (150 mg total) by mouth 2 (two) times daily. 60 capsule 5  . hydrochlorothiazide (HYDRODIURIL) 25 MG tablet Take 1  tablet (25 mg total) by mouth daily. 90 tablet 1  . lisinopril (PRINIVIL,ZESTRIL) 10 MG tablet Take 1 tablet (10 mg total) by mouth daily. 90 tablet 1  . HYDROcodone-acetaminophen (NORCO/VICODIN) 5-325 MG tablet Take 2 tablets by mouth every 4 (four) hours as needed. (Patient not taking: Reported on 05/05/2017) 6 tablet 0  . hydrOXYzine (ATARAX/VISTARIL) 10 MG tablet Take 1 tablet (10 mg total) by mouth every 8 (eight) hours as needed for anxiety (insomnia). (Patient not taking: Reported on 05/05/2017) 30 tablet 0  . methocarbamol (ROBAXIN) 500 MG tablet Take 1 tablet (500 mg total) by mouth 3 (three) times daily. Take only if you continue to have muscle stiffness and soreness (Patient not taking: Reported on 05/05/2017) 90 tablet 0  . ondansetron (ZOFRAN) 4 MG tablet Take 1 tablet (4 mg total) by mouth every 6 (six) hours. (Patient not taking: Reported on 05/05/2017) 12 tablet 0  . oxyCODONE-acetaminophen (PERCOCET/ROXICET) 5-325 MG tablet Take 1 tablet by mouth every 6 (six) hours as needed for severe pain. (Patient not taking: Reported on 05/05/2017) 6 tablet 0  . tamsulosin (FLOMAX) 0.4 MG CAPS capsule Take 1 capsule (0.4 mg total) by mouth daily. (Patient not taking: Reported on 05/05/2017) 10 capsule 0   No facility-administered medications prior to visit.     No Known Allergies     Objective:    BP (!) 146/101 (BP Location: Left Arm, Patient Position: Sitting, Cuff Size: Normal)   Pulse (!) 110   Temp 98.5 F (36.9 C) (Oral)   Ht 5\' 7"  (1.702 m)   Wt 178 lb 3.2 oz (80.8 kg)   LMP 04/19/2017   SpO2 99%   BMI 27.91 kg/m  Wt Readings from Last 3 Encounters:  05/05/17 178 lb 3.2 oz (80.8 kg)  01/01/17 176 lb 9.6 oz (80.1 kg)  12/18/16 182 lb (82.6 kg)    Physical Exam  Constitutional: She is oriented to person, place, and time. She appears well-developed and well-nourished. She is cooperative.  HENT:  Head: Normocephalic and atraumatic.  Eyes: EOM are normal.  Neck: Normal range of  motion.  Cardiovascular: Regular rhythm, normal heart sounds and intact distal pulses. Tachycardia present. Exam reveals no gallop and no friction rub.  No murmur heard. Pulmonary/Chest: Effort normal and breath sounds normal. No tachypnea. No respiratory distress. She has no decreased breath sounds. She has no wheezes. She has no rhonchi. She has no rales. She exhibits no tenderness.  Abdominal: Soft. Bowel sounds are normal.  Musculoskeletal: Normal range of motion. She exhibits no edema.  Neurological: She is alert and oriented to person, place, and time. Coordination normal.  Skin: Skin is warm and dry.  Psychiatric: She has a normal mood and affect. Her speech is normal and behavior is normal. Judgment and thought content normal. Cognition and memory are normal. She expresses no homicidal and no suicidal ideation. She expresses no suicidal plans and no homicidal plans.  Nursing note and vitals reviewed.     Patient has been counseled extensively about nutrition and exercise as well as the importance of adherence with medications and regular follow-up. The patient was given clear instructions to go to ER or return to medical center if symptoms don't improve, worsen  or new problems develop. The patient verbalized understanding.   Follow-up: Return in about 2 weeks (around 05/19/2017) for BP recheck/anxiety.   Claiborne Rigg, FNP-BC [facility] [place], [place] [phone]   05/05/2017, 12:26 PM

## 2017-05-28 ENCOUNTER — Ambulatory Visit: Payer: Self-pay | Admitting: Nurse Practitioner

## 2018-01-20 ENCOUNTER — Other Ambulatory Visit: Payer: Self-pay

## 2018-01-20 DIAGNOSIS — I10 Essential (primary) hypertension: Secondary | ICD-10-CM

## 2018-01-20 MED ORDER — LISINOPRIL 10 MG PO TABS: 10.0000 mg | ORAL_TABLET | Freq: Every day | ORAL | 0 refills | Status: AC

## 2018-02-22 IMAGING — US US RENAL
1 series · 14 of 25 positions shown · non-contrast
Comparison: CT abdomen pelvis from yesterday.

CLINICAL DATA: Left flank pain.

EXAM:
RENAL / URINARY TRACT ULTRASOUND COMPLETE

[Series 1: us renal · 0.25mm/px · 14 of 47 slices shown]
[im 1/47]
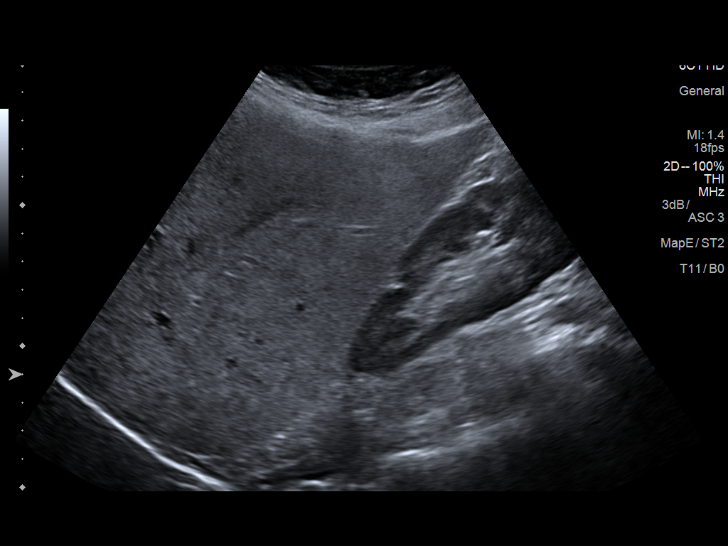
[im 4/47]
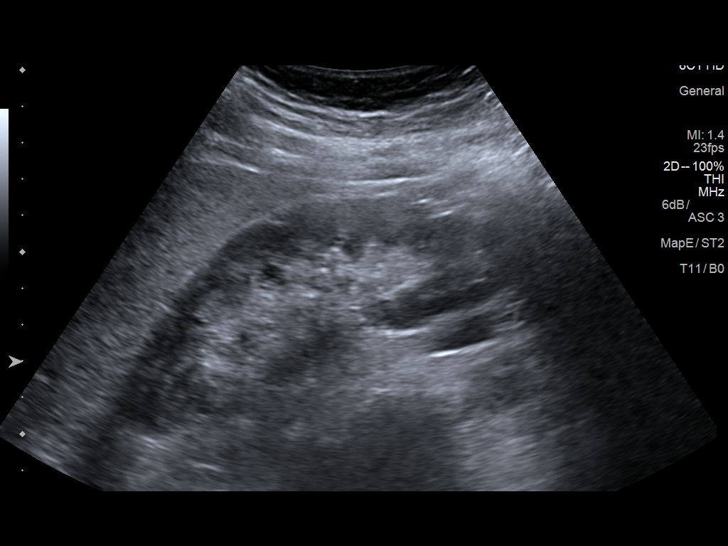
[im 8/47]
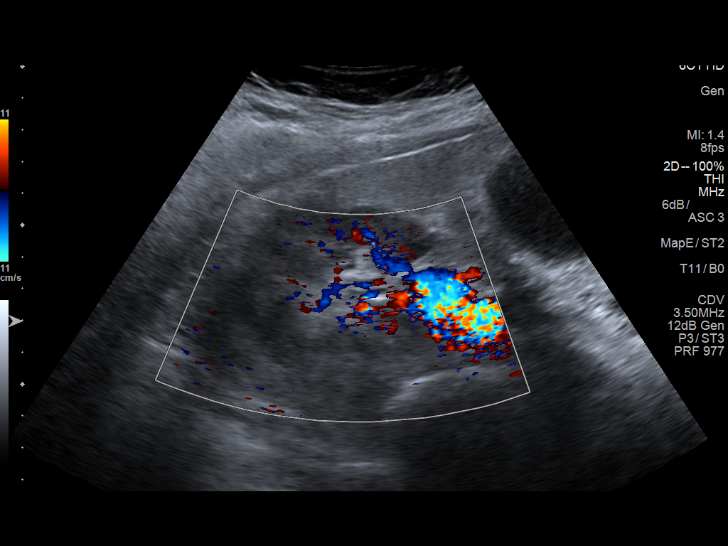
[im 12/47]
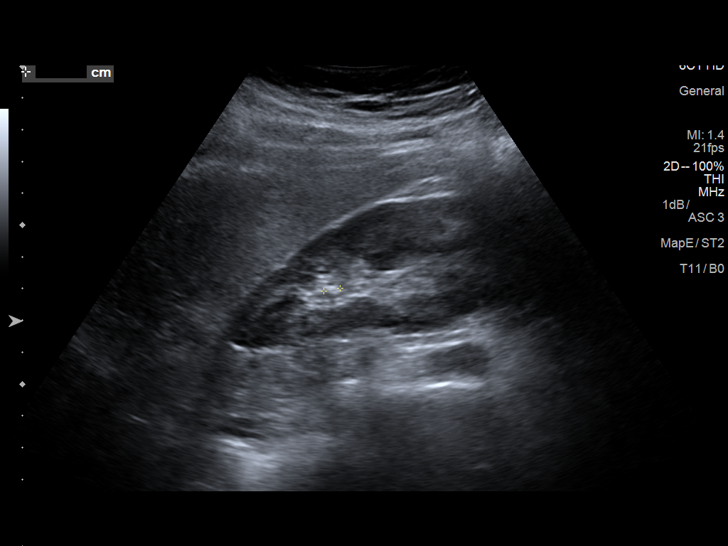
[im 16/47]
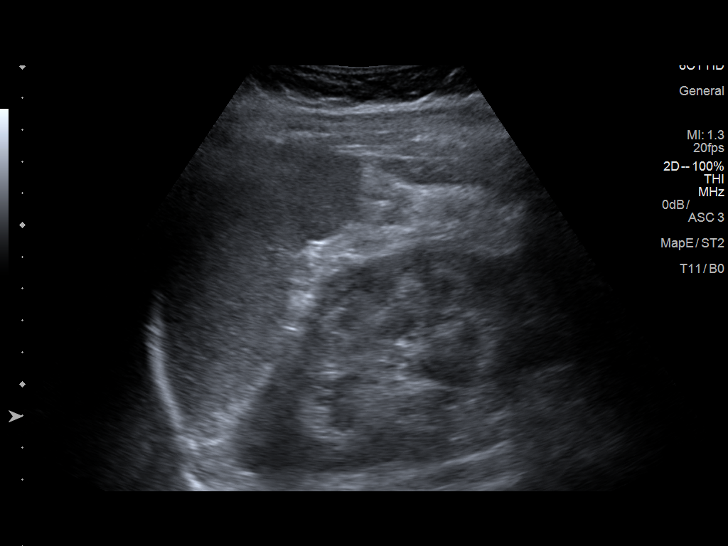
[im 18/47]
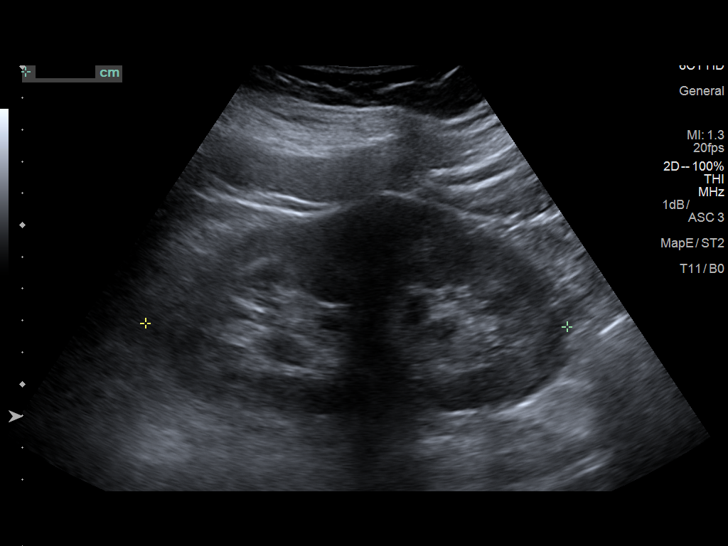
[im 22/47]
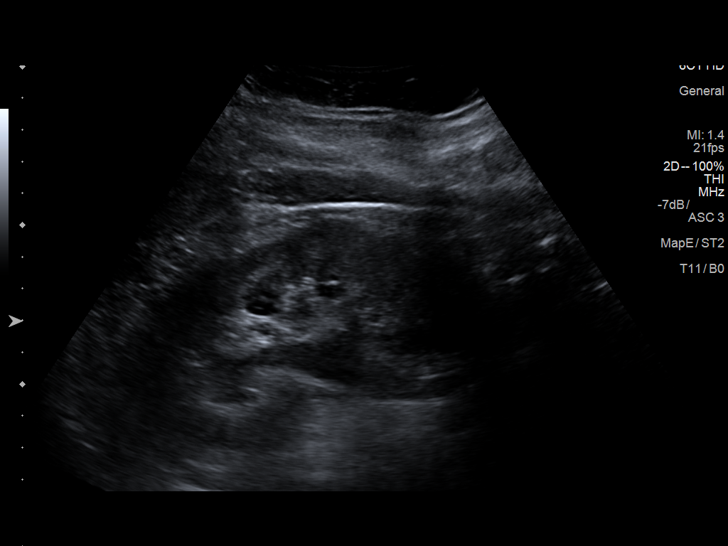
[im 25/47]
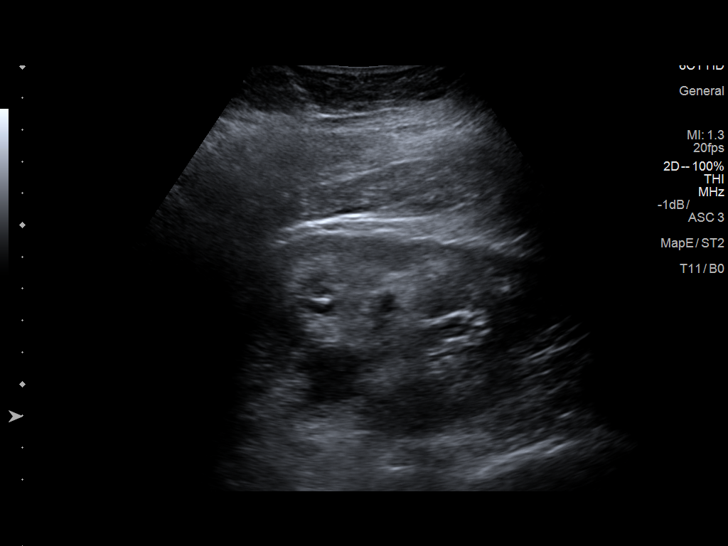
[im 29/47]
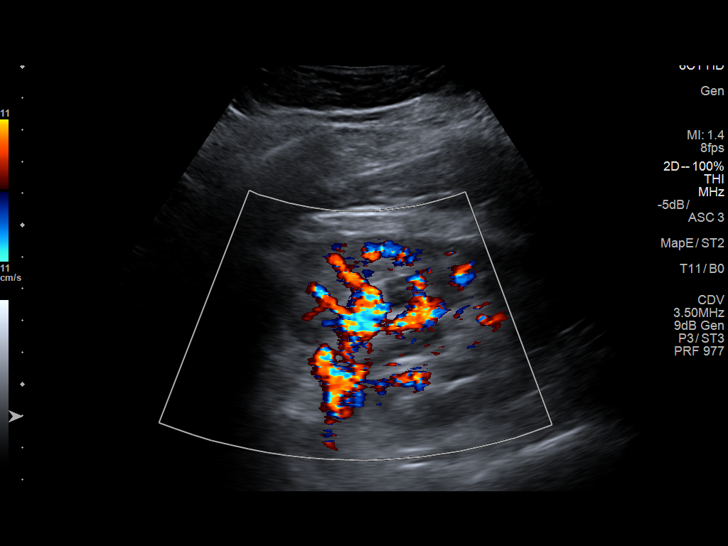
[im 31/47]
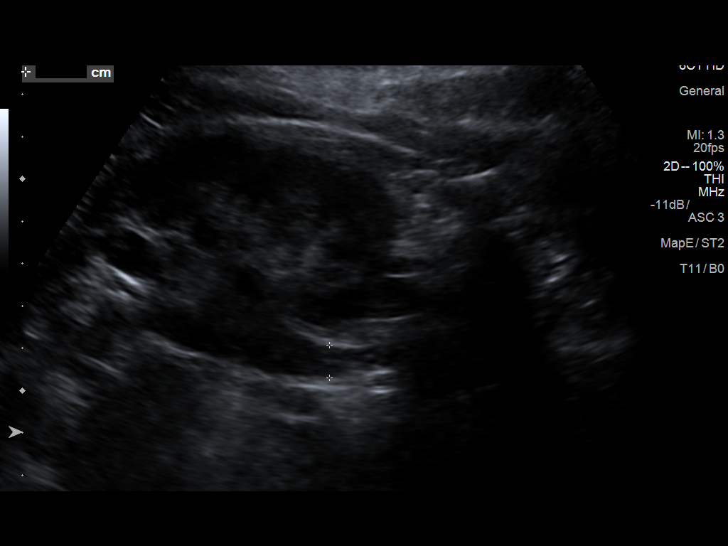
[im 35/47]
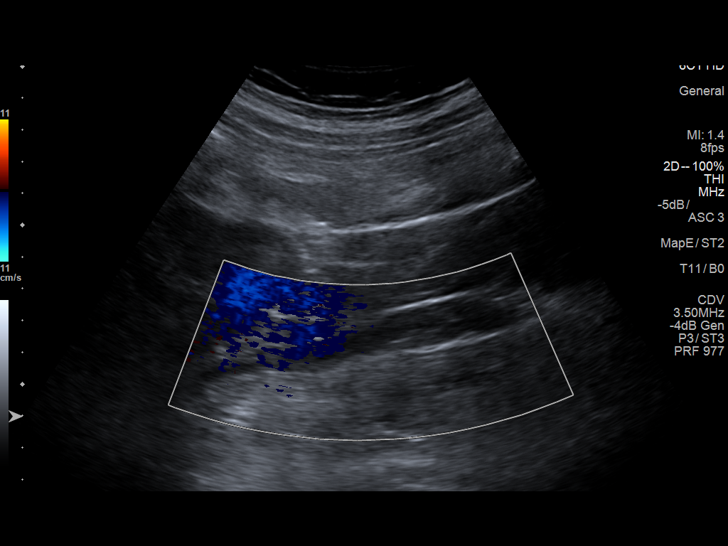
[im 39/47]
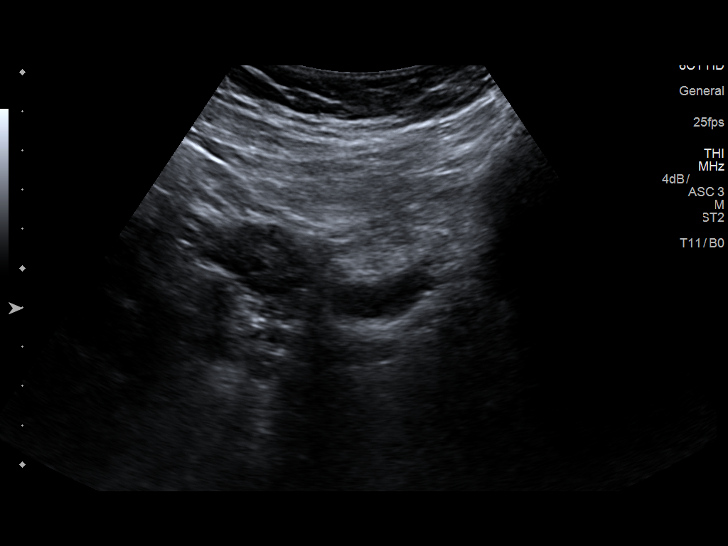
[im 43/47]
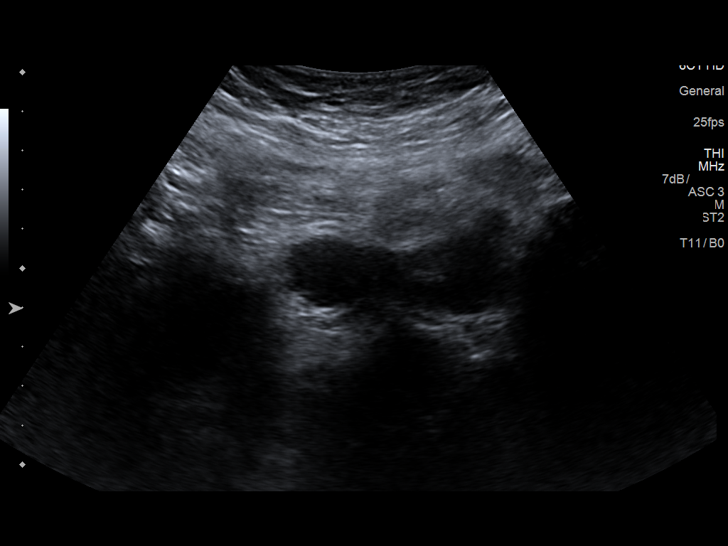
[im 47/47]
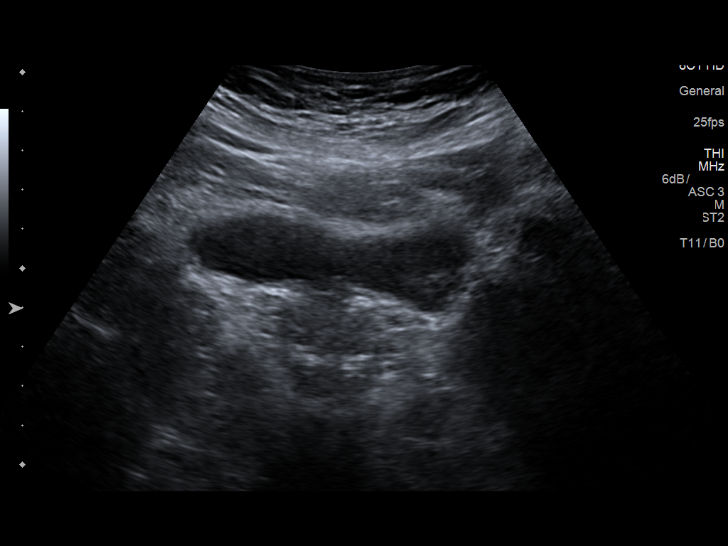

[14 of 25 positions shown; findings below may reference images not displayed]

FINDINGS: Right Kidney:

Length: 11.9 cm. Echogenicity within normal limits. A few small
shadowing calculi are noted. No mass or hydronephrosis visualized.

Left Kidney:

Length: 13.3 cm. Echogenicity within normal limits. Mild
hydronephrosis and proximal hydroureter.

Bladder:

Decompressed.
IMPRESSION: 1. Mild left hydroureteronephrosis secondary to calculus in the
distal ureter better seen on pelvic ultrasound from same day and CT
from yesterday.
2. Additional nonobstructive right renal calculi.

## 2019-03-01 IMAGING — CT CT ABD-PELV W/ CM
2 of 4 series · 16 of 46 positions shown, 18 images · IV contrast (agent unspecified)
Comparison: None.

CLINICAL DATA: Bilateral flank pain for 4 days, worse on the right.
Nausea and vomiting. Microhematuria.

EXAM:
CT ABDOMEN AND PELVIS WITH CONTRAST
TECHNIQUE: Multidetector CT imaging of the abdomen and pelvis was performed
using the standard protocol following bolus administration of
intravenous contrast.
CONTRAST:  100 mL Ssovue-1MM

[Series 2: abd/pel with · axial · 0.70mm/px · z∈[-349,+71]mm · 13 of 94 slices shown, 15 images]
[im 5/94  soft-tissue]
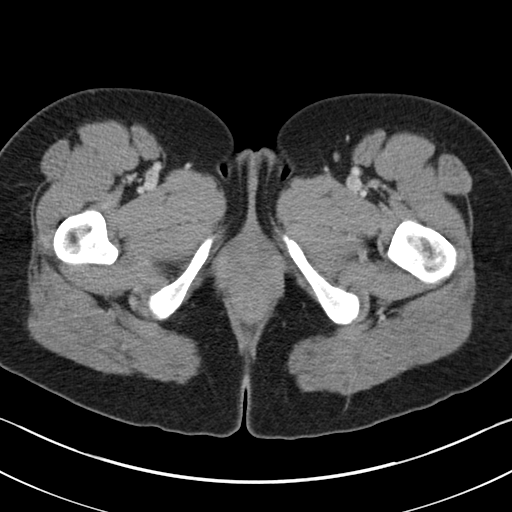
[im 5/94  bone]
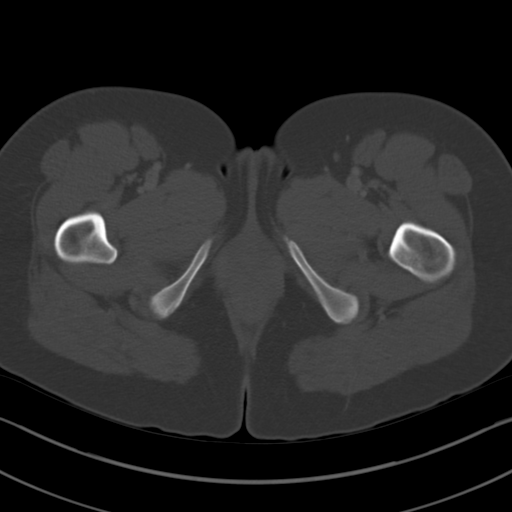
[im 14/94  soft-tissue]
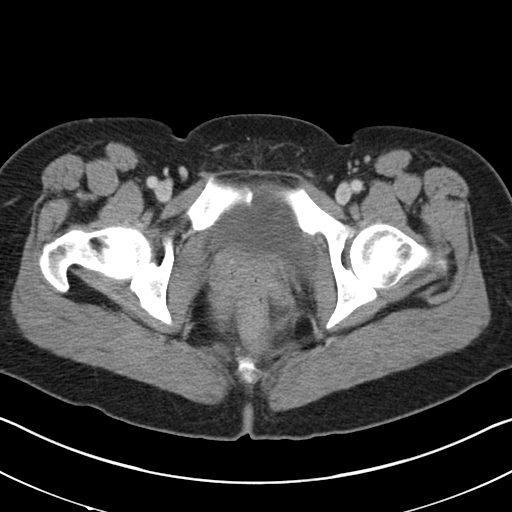
[im 19/94  soft-tissue]
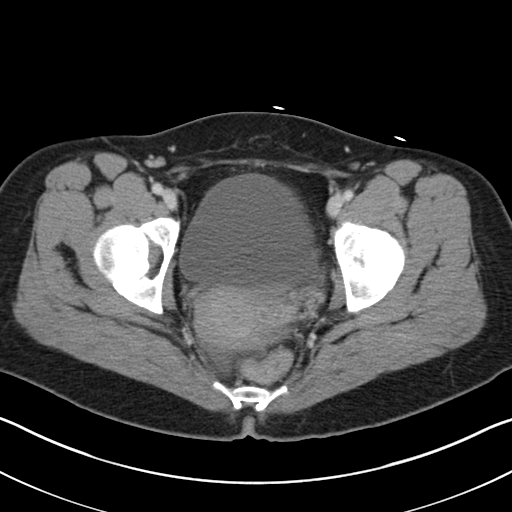
[im 28/94  soft-tissue]
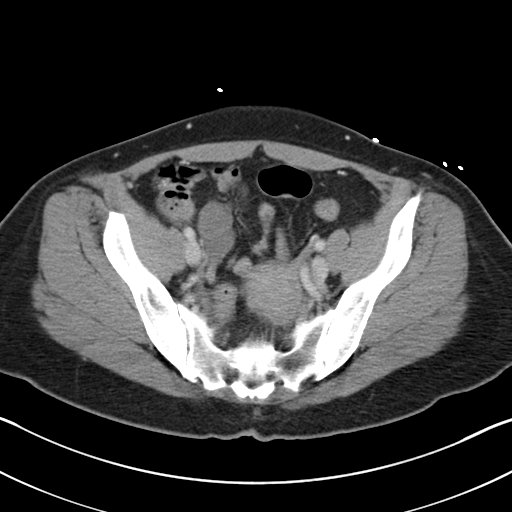
[im 33/94  soft-tissue]
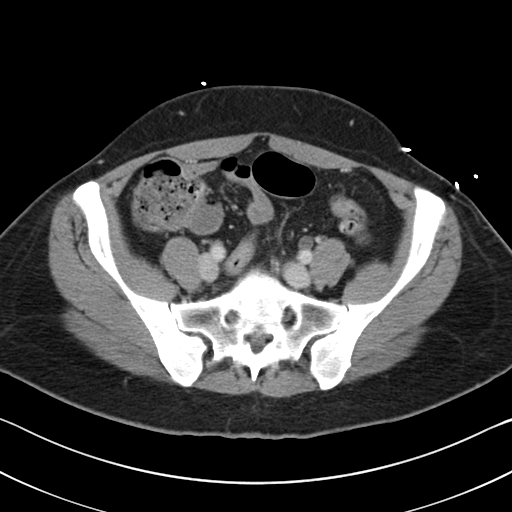
[im 42/94  soft-tissue]
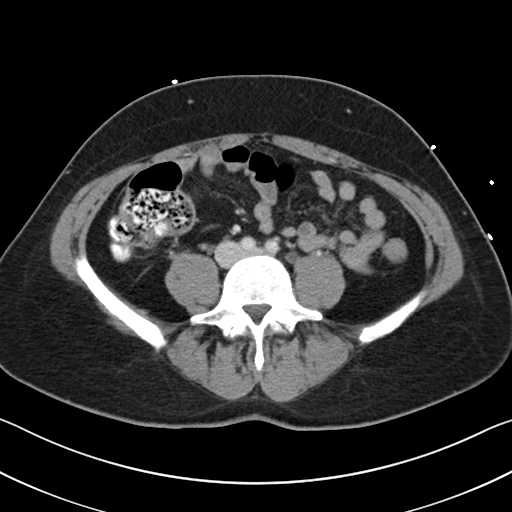
[im 47/94  soft-tissue]
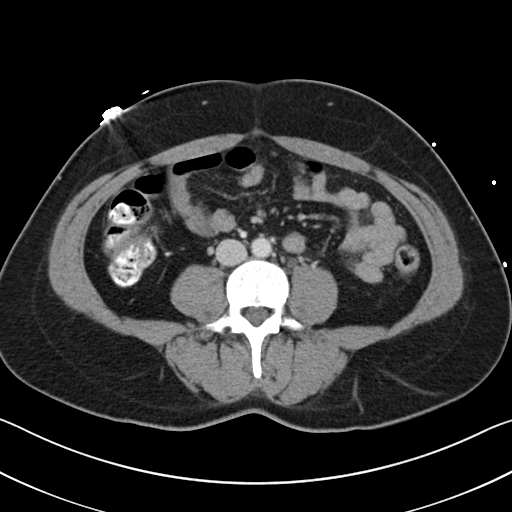
[im 52/94  soft-tissue]
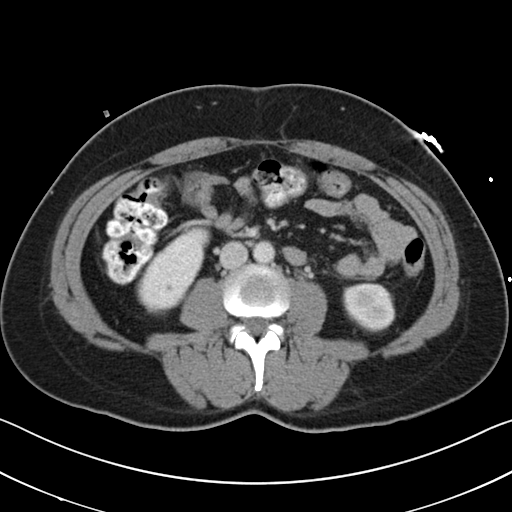
[im 61/94  soft-tissue]
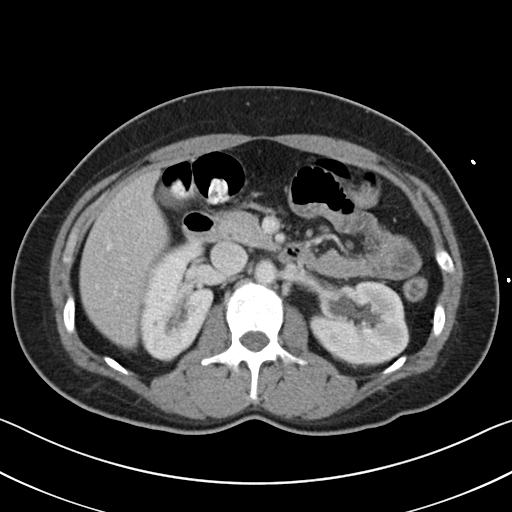
[im 61/94  bone]
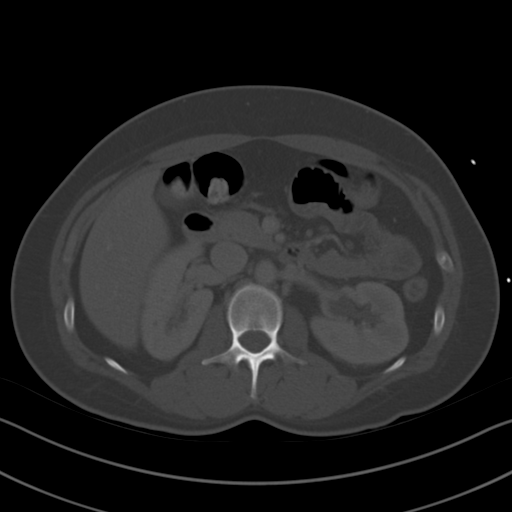
[im 66/94  soft-tissue]
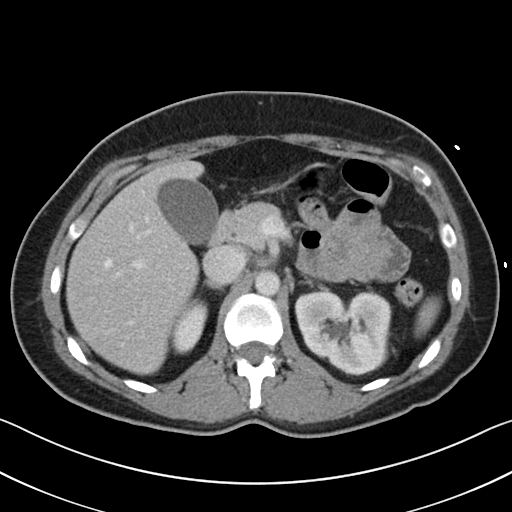
[im 75/94  soft-tissue]
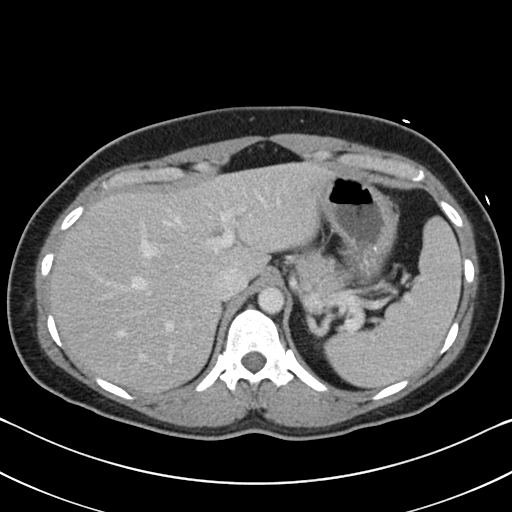
[im 80/94  soft-tissue]
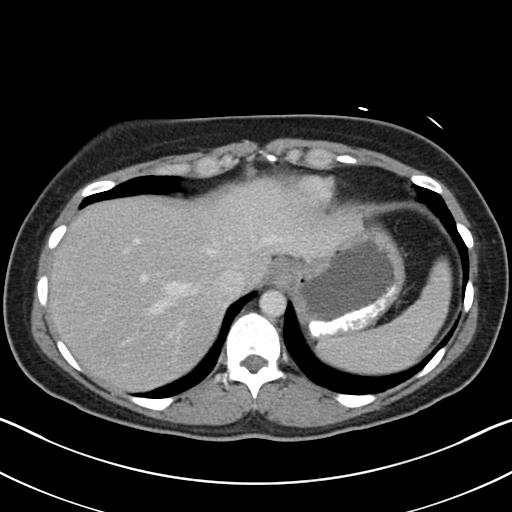
[im 89/94  soft-tissue]
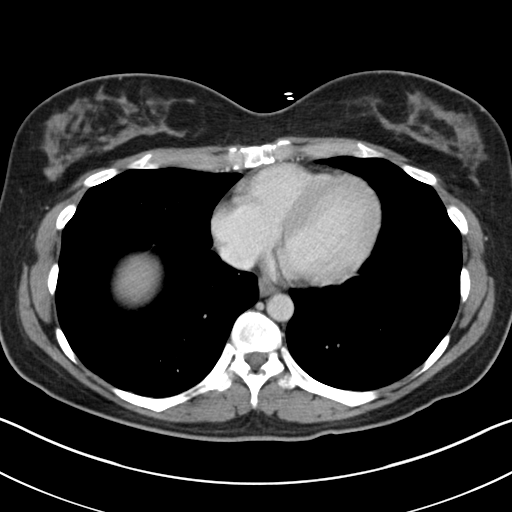

[Series 4: coronal a/|p · coronal · 0.74mm/px · 3 of 113 slices shown]
[im 38/113  soft-tissue]
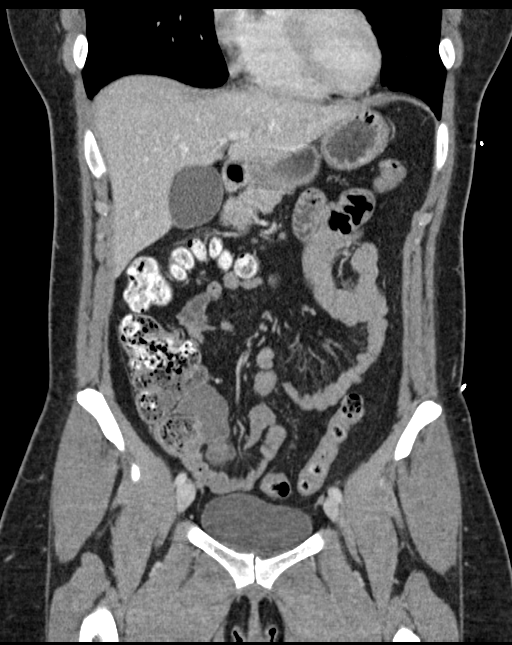
[im 50/113  soft-tissue]
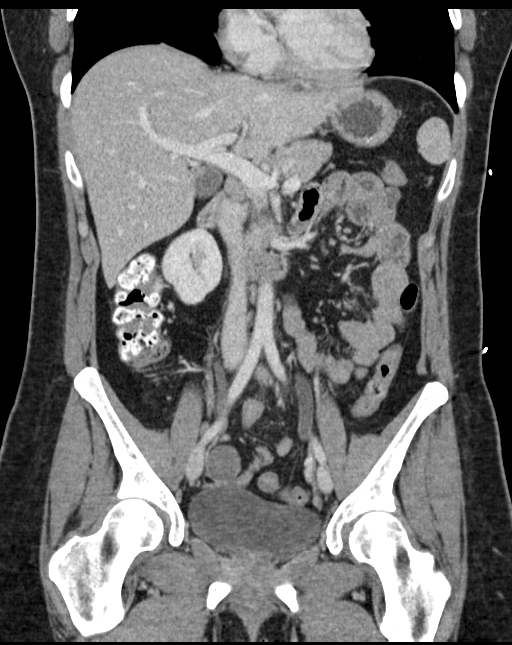
[im 63/113  soft-tissue]
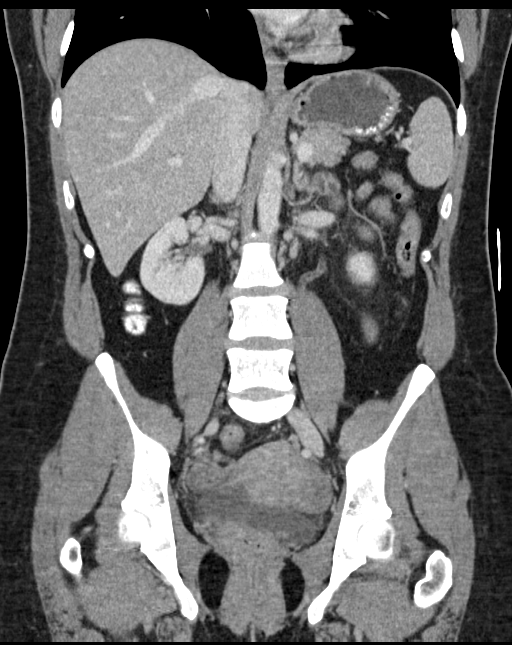

[16 of 46 positions shown; findings below may reference images not displayed]

FINDINGS: Lower chest: The lung bases are clear.

Hepatobiliary: No focal liver abnormality is seen. No gallstones,
gallbladder wall thickening, or biliary dilatation.

Pancreas: Unremarkable. No pancreatic ductal dilatation or
surrounding inflammatory changes.

Spleen: Normal in size without focal abnormality.

Adrenals/Urinary Tract: No adrenal gland nodules. Slightly delayed
nephrogram on the left kidney. Mild left hydronephrosis and
hydroureter. 4 mm stone in the distal left ureter just above the
ureterovesical junction. Punctate size stone in the midpole right
kidney. No hydronephrosis or hydroureter on the right. No bladder
wall thickening or bladder filling defects.

Stomach/Bowel: Stomach is within normal limits. Appendix appears
normal. No evidence of bowel wall thickening, distention, or
inflammatory changes.

Vascular/Lymphatic: No significant vascular findings are present. No
enlarged abdominal or pelvic lymph nodes.

Reproductive: Uterus and ovaries are not enlarged. Probable
involuting cyst on the left ovary. Small amount of free fluid in the
pelvis is likely physiologic.

Other: No free air in the abdomen. Abdominal wall musculature
appears intact.

Musculoskeletal: No acute or significant osseous findings.
IMPRESSION: 1. 4 mm stone in the distal left ureter with moderate proximal
obstruction.
2. Nonobstructing stone in the right kidney.
3. Physiologic free fluid in the pelvis with involuting follicle of
the left ovary.

## 2019-03-02 IMAGING — CR DG ABDOMEN 1V
2 series · 2 of 2 positions shown · non-contrast
Comparison: CT abdomen pelvis from yesterday.

CLINICAL DATA: Worsening abdominal pain. Bilateral renal calculi on
recent CT.

EXAM:
ABDOMEN - 1 VIEW

[t abdomen supine (1 of 2)]
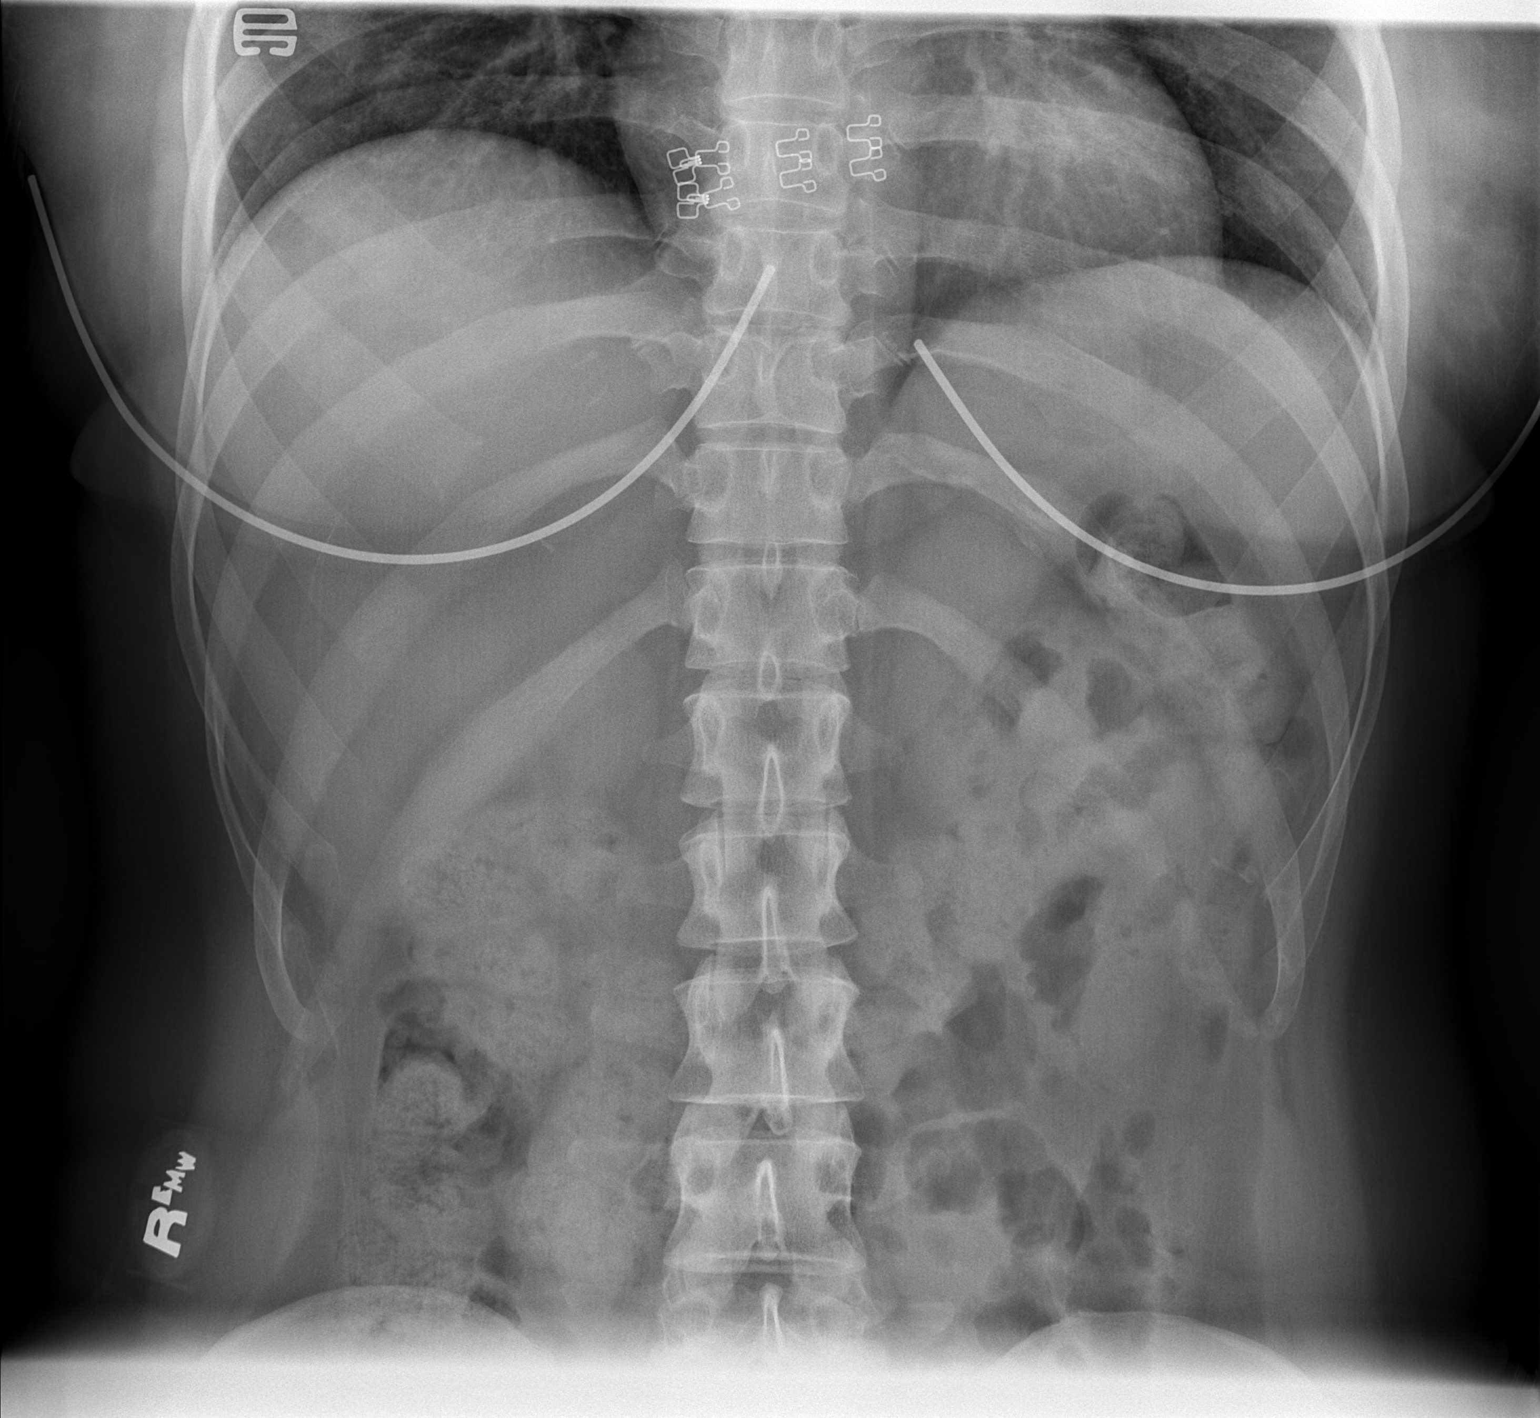

[t abdomen supine (2 of 2)]
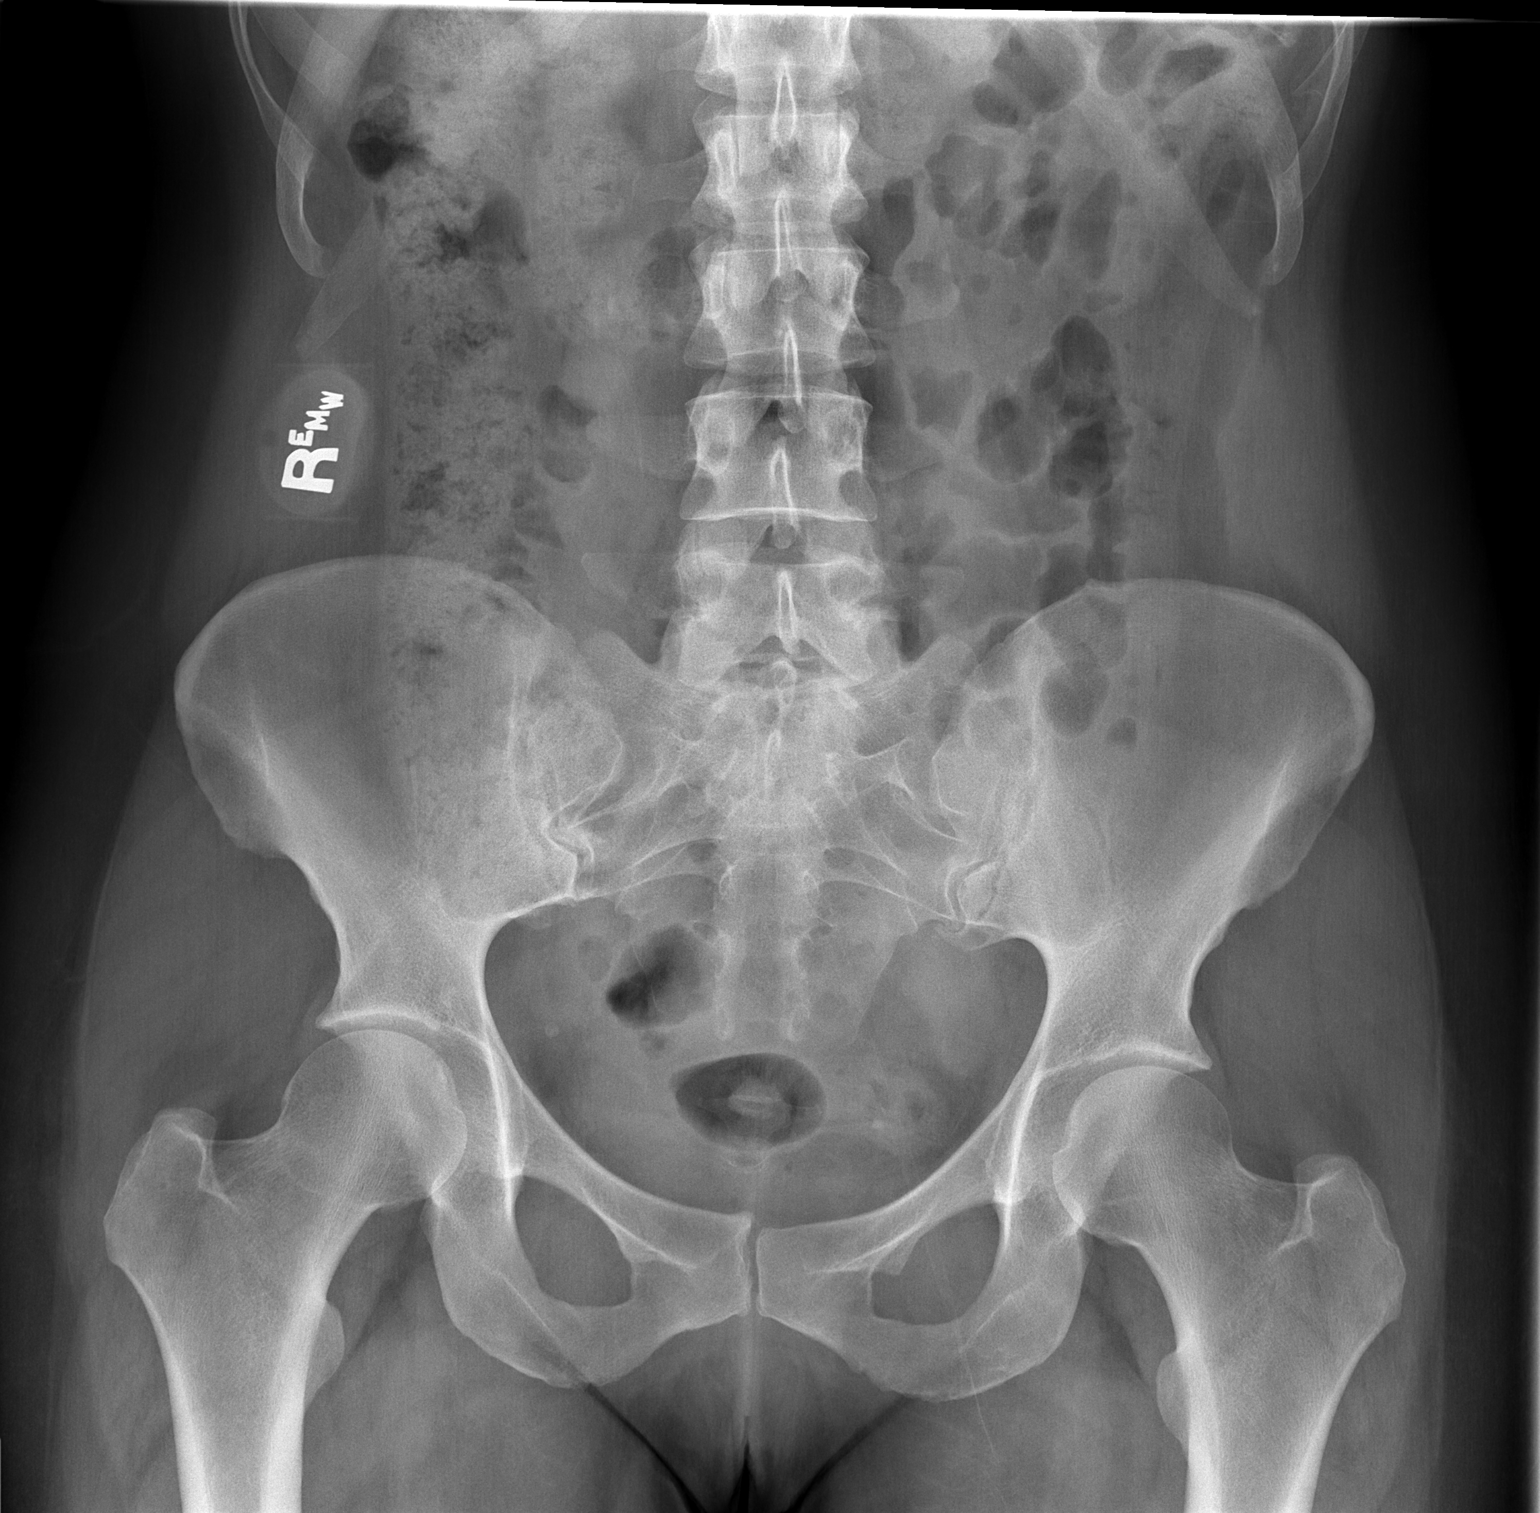

[2 of 2 positions shown; findings below may reference images not displayed]

FINDINGS: Unchanged 4 mm calculus at the left ureterovesical junction. No
additional radiopaque calculi. Phlebolith in the right pelvis.
Nonobstructive bowel gas pattern.
IMPRESSION: Unchanged 4 mm calculus at the left ureterovesical junction.
